# Patient Record
Sex: Female | Born: 1960 | Race: Black or African American | Hispanic: No | Marital: Married | Smoking: Never smoker
Health system: Southern US, Community
[De-identification: ages and names within clinical notes are randomized; demographics above are authoritative.]

## PROBLEM LIST (undated history)

## (undated) DIAGNOSIS — G4730 Sleep apnea, unspecified: Secondary | ICD-10-CM

## (undated) DIAGNOSIS — R600 Localized edema: Secondary | ICD-10-CM

## (undated) DIAGNOSIS — E669 Obesity, unspecified: Secondary | ICD-10-CM

## (undated) DIAGNOSIS — I219 Acute myocardial infarction, unspecified: Secondary | ICD-10-CM

## (undated) DIAGNOSIS — T7840XA Allergy, unspecified, initial encounter: Secondary | ICD-10-CM

## (undated) DIAGNOSIS — I890 Lymphedema, not elsewhere classified: Secondary | ICD-10-CM

## (undated) DIAGNOSIS — T753XXA Motion sickness, initial encounter: Secondary | ICD-10-CM

## (undated) HISTORY — DX: Obesity, unspecified: E66.9

## (undated) HISTORY — DX: Allergy, unspecified, initial encounter: T78.40XA

## (undated) HISTORY — DX: Sleep apnea, unspecified: G47.30

## (undated) HISTORY — DX: Localized edema: R60.0

## (undated) HISTORY — DX: Lymphedema, not elsewhere classified: I89.0

---

## 2004-10-10 HISTORY — PX: ABDOMINAL HYSTERECTOMY: SHX81

## 2004-11-15 ENCOUNTER — Ambulatory Visit: Payer: Self-pay | Admitting: Family Medicine

## 2004-11-17 ENCOUNTER — Ambulatory Visit: Payer: Self-pay | Admitting: Family Medicine

## 2005-09-27 ENCOUNTER — Ambulatory Visit: Payer: Self-pay | Admitting: Obstetrics & Gynecology

## 2006-06-05 ENCOUNTER — Ambulatory Visit: Payer: Self-pay | Admitting: Family Medicine

## 2006-12-08 ENCOUNTER — Ambulatory Visit: Payer: Self-pay | Admitting: Family Medicine

## 2008-02-15 ENCOUNTER — Ambulatory Visit: Payer: Self-pay | Admitting: Family Medicine

## 2008-10-10 ENCOUNTER — Emergency Department: Payer: Self-pay | Admitting: Emergency Medicine

## 2009-12-14 ENCOUNTER — Ambulatory Visit: Payer: Self-pay | Admitting: Family Medicine

## 2011-07-09 IMAGING — CR DG HAND COMPLETE 3+V*L*
1 series · 3 of 3 positions shown · non-contrast
Comparison: none

REASON FOR EXAM: pain
COMMENTS:

PROCEDURE:     KDR - KDXR HAND LT COMPLETE W/OBLIQUES  - December 14, 2009  [DATE]
RESULT:     No fracture, dislocation or other acute bony abnormality is
identified. No definite arthritic changes are seen. No radiodense soft
tissue foreign body is noted.

[Series 2: view not recorded · 0.17mm/px · 3 of 3 slices shown]
[im 1/3]
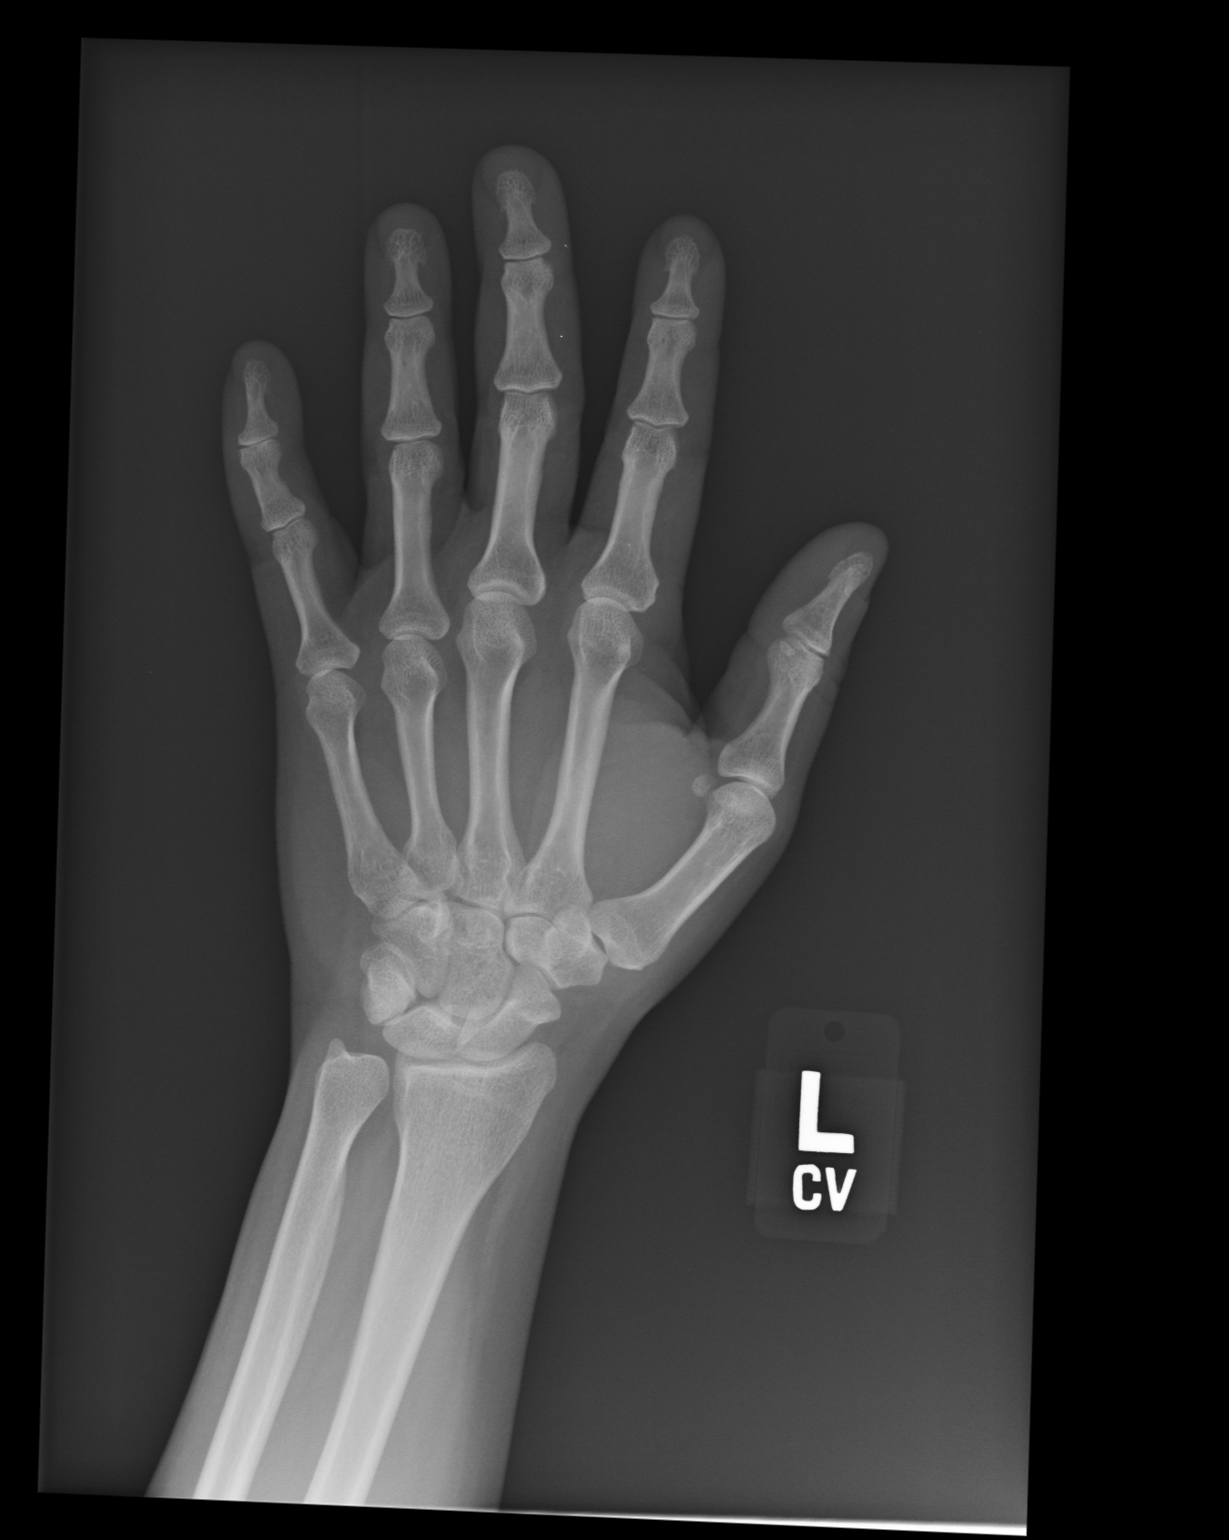
[im 2/3]
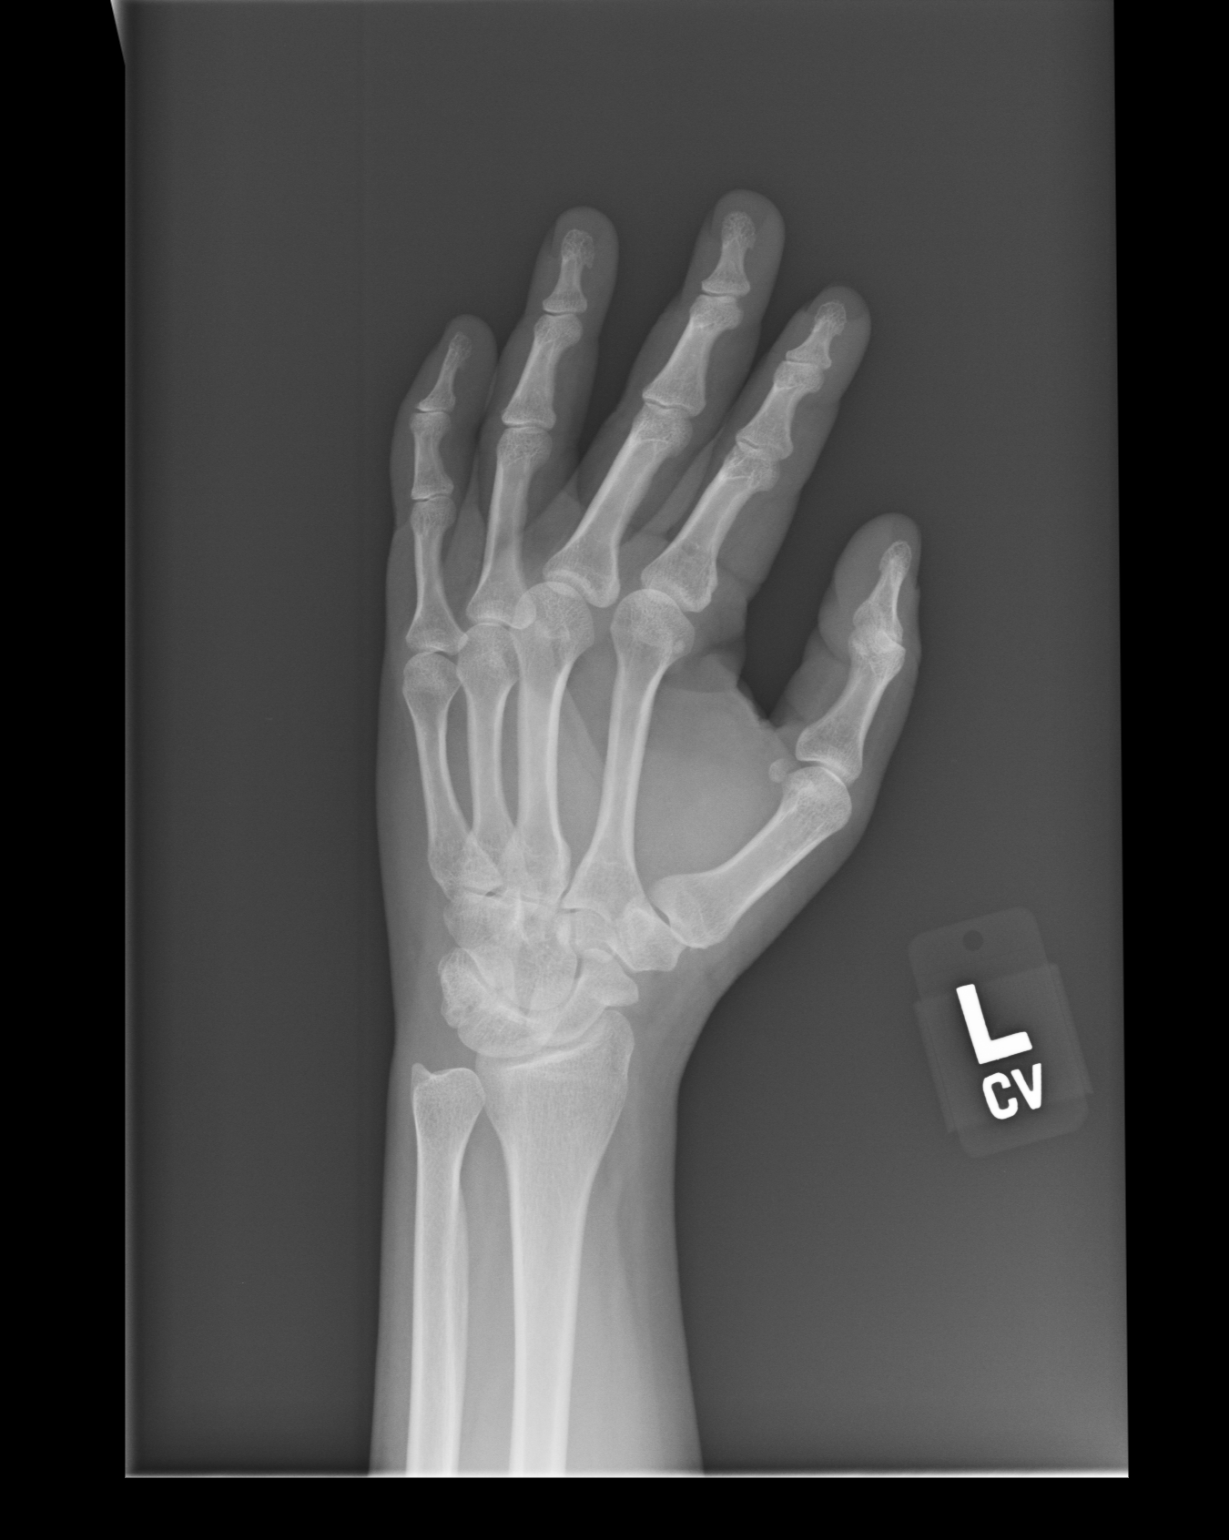
[im 3/3]
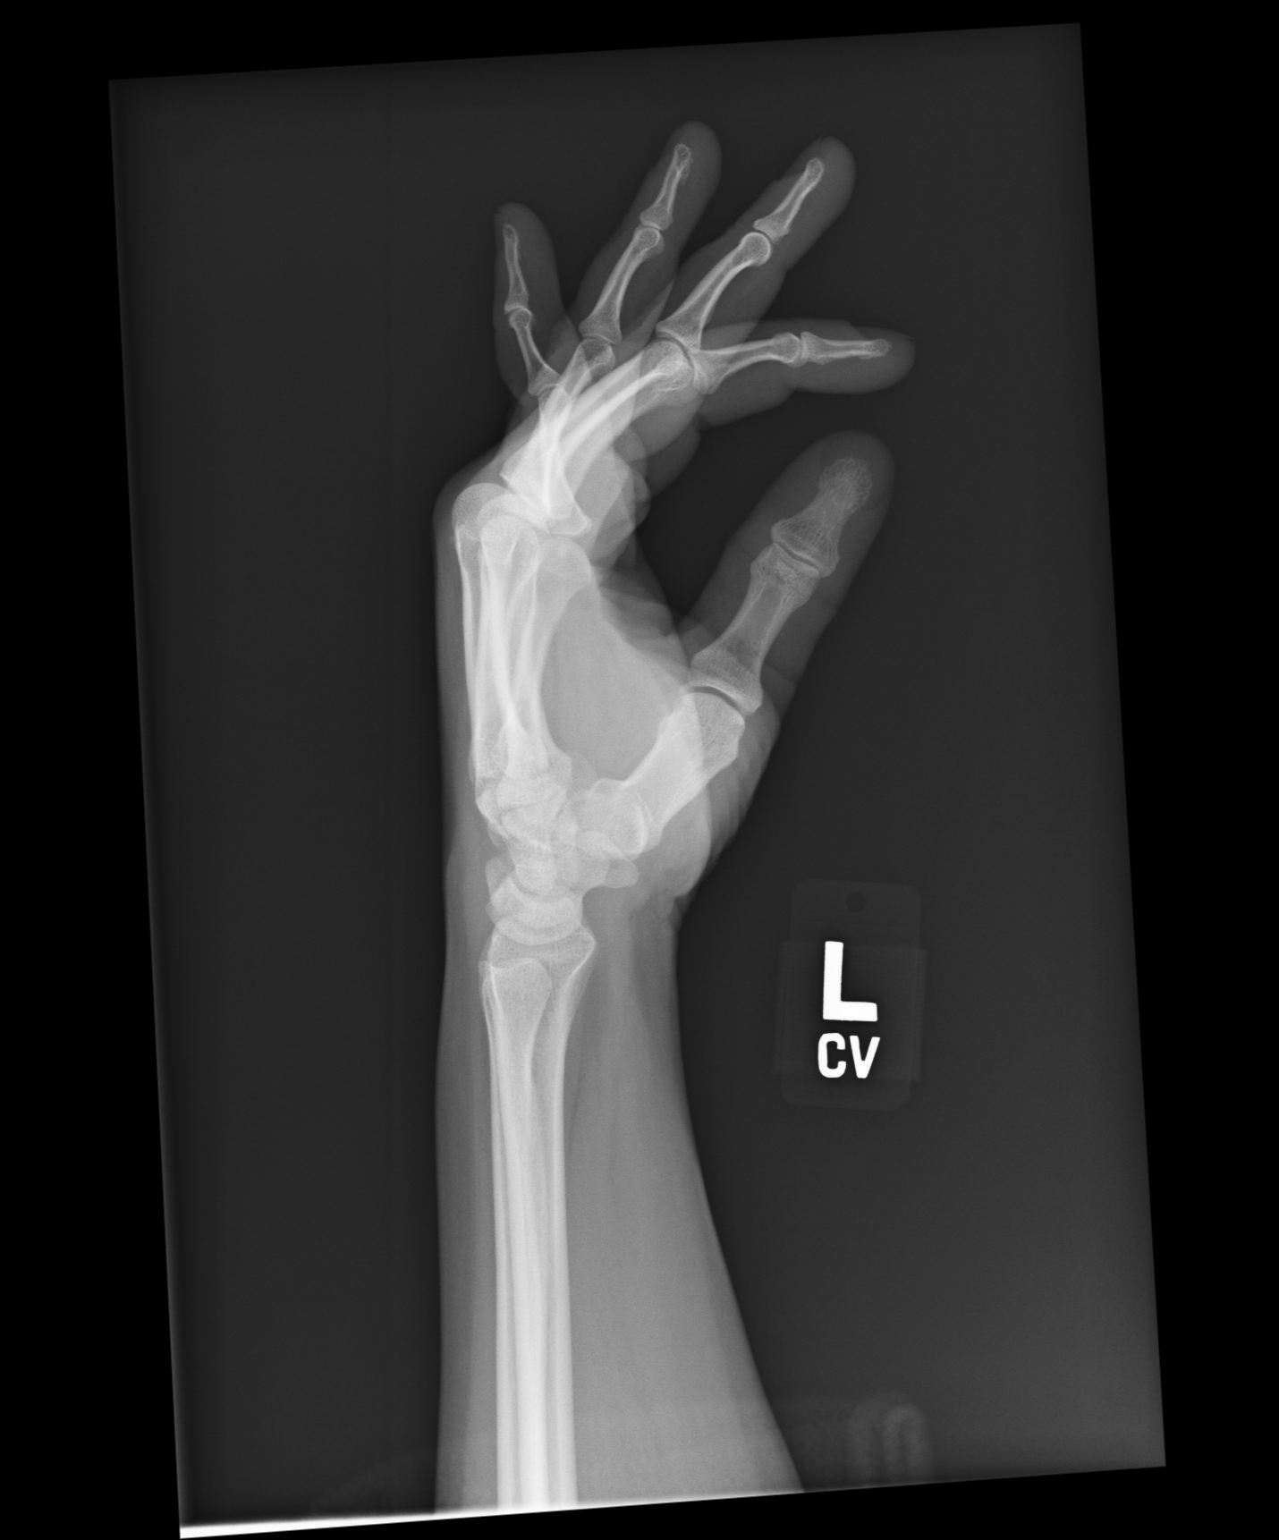

[3 of 3 positions shown; findings below may reference images not displayed]

IMPRESSION: 1.     No significant abnormalities are identified.

## 2013-11-13 ENCOUNTER — Ambulatory Visit: Payer: Self-pay | Admitting: Family Medicine

## 2013-11-13 LAB — URINALYSIS, COMPLETE

## 2013-11-15 LAB — URINE CULTURE

## 2015-06-04 ENCOUNTER — Ambulatory Visit: Payer: Self-pay | Admitting: Family Medicine

## 2015-07-24 ENCOUNTER — Telehealth: Payer: Self-pay | Admitting: Family Medicine

## 2015-07-24 NOTE — Telephone Encounter (Signed)
Pt would like refill on fluid pill to be sent to [address]. Pt has an appt on 08/27/15

## 2015-07-27 MED ORDER — TRIAMTERENE-HCTZ 37.5-25 MG PO TABS: 1.0000 | ORAL_TABLET | Freq: Every day | ORAL | Status: DC

## 2015-07-27 NOTE — Telephone Encounter (Signed)
Pt last OV was 08/20/14 for CPE,no current medications were in allscripts.

## 2015-07-27 NOTE — Telephone Encounter (Signed)
Okay 3 month.  Was returned office before further refills for lab work and evaluation

## 2015-07-27 NOTE — Telephone Encounter (Signed)
Pt must RTO for any refills and needs current lab work

## 2015-08-19 ENCOUNTER — Ambulatory Visit (INDEPENDENT_AMBULATORY_CARE_PROVIDER_SITE_OTHER): Payer: BLUE CROSS/BLUE SHIELD | Admitting: Family Medicine

## 2015-08-19 ENCOUNTER — Encounter: Payer: Self-pay | Admitting: Family Medicine

## 2015-08-19 VITALS — BP 122/77 | HR 98 | Temp 98.7°F | Resp 18 | Ht 64.0 in | Wt 199.3 lb

## 2015-08-19 DIAGNOSIS — R1011 Right upper quadrant pain: Secondary | ICD-10-CM | POA: Diagnosis not present

## 2015-08-19 DIAGNOSIS — R10A1 Flank pain, right side: Secondary | ICD-10-CM | POA: Insufficient documentation

## 2015-08-19 DIAGNOSIS — R109 Unspecified abdominal pain: Secondary | ICD-10-CM | POA: Insufficient documentation

## 2015-08-19 MED ORDER — TRAMADOL HCL 50 MG PO TABS: 50.0000 mg | ORAL_TABLET | Freq: Three times a day (TID) | ORAL | Status: DC | PRN

## 2015-08-19 NOTE — Progress Notes (Signed)
Name: Sierra Buck   MRN: [id number]    DOB: 02-07-1961   Date:08/19/2015       Progress Note  Subjective  Chief Complaint  Chief Complaint  Patient presents with  . Back Pain    x2 days, patient states hurts when she walkss, pressure when urinating     Back Pain This is a new problem. The current episode started yesterday. The pain is present in the lumbar spine. The pain does not radiate. The pain is at a severity of 8/10. The symptoms are aggravated by position (walking makes it worse.). Pertinent negatives include no bladder incontinence, bowel incontinence, dysuria, fever, leg pain, numbness, paresis, paresthesias or weakness. She has tried nothing for the symptoms.    Past Medical History  Diagnosis Date  . Allergy   . Sleep apnea   . Bilateral leg edema     Past Surgical History  Procedure Laterality Date  . Abdominal hysterectomy  2006    Partial    Family History  Problem Relation Age of Onset  . Hypertension Mother   . Hypertension Sister     Social History   Social History  . Marital Status: Married    Spouse Name: N/A  . Number of Children: N/A  . Years of Education: N/A   Occupational History  . Not on file.   Social History Main Topics  . Smoking status: Never Smoker   . Smokeless tobacco: Never Used  . Alcohol Use: 0.0 oz/week    0 Standard drinks or equivalent per week     Comment: occasional  . Drug Use: No  . Sexual Activity:    Partners: Male   Other Topics Concern  . Not on file   Social History Narrative  . No narrative on file     Current outpatient prescriptions:  .  triamterene-hydrochlorothiazide (MAXZIDE-25) 37.5-25 MG tablet, Take 1 tablet by mouth daily., Disp: 30 tablet, Rfl: 1  No Known Allergies  Review of Systems  Constitutional: Negative for fever.  Gastrointestinal: Negative for bowel incontinence.  Genitourinary: Negative for bladder incontinence and dysuria.  Musculoskeletal: Positive for back pain.    Neurological: Negative for weakness, numbness and paresthesias.   Objective  Filed Vitals:   08/19/15 1525  BP: 122/77  Pulse: 98  Temp: 98.7 F (37.1 C)  TempSrc: Oral  Resp: 18  Height: 5\' 4"  (1.626 m)  Weight: 199 lb 4.8 oz (90.402 kg)  SpO2: 98%    Physical Exam  Constitutional: She is oriented to person, place, and time and well-developed, well-nourished, and in no distress.  Cardiovascular: Normal rate, regular rhythm and normal heart sounds.   No murmur heard. Pulmonary/Chest: Effort normal and breath sounds normal. She has no rales.  Abdominal: Soft. Bowel sounds are normal. There is tenderness (Patient 'feels' the pain in her right flank, no tenderness over spine or paraspinal muscles). There is no CVA tenderness.  Musculoskeletal:       Lumbar back: She exhibits normal range of motion, no tenderness, no pain and no spasm.       Back:  Neurological: She is alert and oriented to person, place, and time.  Nursing note and vitals reviewed.  Assessment & Plan  1. Acute right flank pain Pressure but no tenderness over the right flank. We will rule out renal etiology with ultrasound. Obtain laboratory workup and urinalysis.  - US Renal; Future - CBC with Differential - COMPLETE METABOLIC PANEL WITH GFR - Urine Culture - Urinalysis, Routine w  reflex microscopic - traMADol (ULTRAM) 50 MG tablet; Take 1 tablet (50 mg total) by mouth every 8 (eight) hours as needed.  Dispense: 30 tablet; Refill: 0   Judeen Geralds [facility] 08/19/2015 4:14 PM

## 2015-08-20 LAB — CBC WITH DIFFERENTIAL/PLATELET
BASOS ABS: 0 10*3/uL (ref 0.0–0.2)
Basos: 1 %
EOS (ABSOLUTE): 0.3 10*3/uL (ref 0.0–0.4)
Eos: 5 %
Hematocrit: 43 % (ref 34.0–46.6)
Hemoglobin: 13.8 g/dL (ref 11.1–15.9)
IMMATURE GRANULOCYTES: 0 %
Immature Grans (Abs): 0 10*3/uL (ref 0.0–0.1)
Lymphocytes Absolute: 2.9 10*3/uL (ref 0.7–3.1)
Lymphs: 49 %
MCH: 28.5 pg (ref 26.6–33.0)
MCHC: 32.1 g/dL (ref 31.5–35.7)
MCV: 89 fL (ref 79–97)
MONOS ABS: 0.6 10*3/uL (ref 0.1–0.9)
Monocytes: 11 %
NEUTROS PCT: 34 %
Neutrophils Absolute: 2 10*3/uL (ref 1.4–7.0)
PLATELETS: 323 10*3/uL (ref 150–379)
RBC: 4.84 x10E6/uL (ref 3.77–5.28)
RDW: 13.8 % (ref 12.3–15.4)
WBC: 5.9 10*3/uL (ref 3.4–10.8)

## 2015-08-20 LAB — URINALYSIS, ROUTINE W REFLEX MICROSCOPIC
Bilirubin, UA: NEGATIVE
GLUCOSE, UA: NEGATIVE
Ketones, UA: NEGATIVE
LEUKOCYTES UA: NEGATIVE
Nitrite, UA: NEGATIVE
PROTEIN UA: NEGATIVE
SPEC GRAV UA: 1.025 (ref 1.005–1.030)
Urobilinogen, Ur: 0.2 mg/dL (ref 0.2–1.0)
pH, UA: 5.5 (ref 5.0–7.5)

## 2015-08-20 LAB — MICROSCOPIC EXAMINATION: Casts: NONE SEEN /lpf

## 2015-08-20 LAB — COMPREHENSIVE METABOLIC PANEL
A/G RATIO: 1.3 (ref 1.1–2.5)
ALK PHOS: 94 IU/L (ref 39–117)
ALT: 14 IU/L (ref 0–32)
AST: 18 IU/L (ref 0–40)
Albumin: 4.4 g/dL (ref 3.5–5.5)
BILIRUBIN TOTAL: 0.2 mg/dL (ref 0.0–1.2)
BUN/Creatinine Ratio: 19 (ref 9–23)
BUN: 15 mg/dL (ref 6–24)
CHLORIDE: 99 mmol/L (ref 97–106)
CO2: 28 mmol/L (ref 18–29)
Calcium: 9.9 mg/dL (ref 8.7–10.2)
Creatinine, Ser: 0.77 mg/dL (ref 0.57–1.00)
GFR calc non Af Amer: 90 mL/min/{1.73_m2} (ref >59)
GFR, EST AFRICAN AMERICAN: 103 mL/min/{1.73_m2} (ref >59)
Globulin, Total: 3.3 g/dL (ref 1.5–4.5)
Glucose: 72 mg/dL (ref 65–99)
POTASSIUM: 4.2 mmol/L (ref 3.5–5.2)
Sodium: 143 mmol/L (ref 136–144)
TOTAL PROTEIN: 7.7 g/dL (ref 6.0–8.5)

## 2015-08-20 LAB — URINE CULTURE

## 2015-08-27 ENCOUNTER — Encounter: Payer: Self-pay | Admitting: Family Medicine

## 2015-08-27 ENCOUNTER — Ambulatory Visit (INDEPENDENT_AMBULATORY_CARE_PROVIDER_SITE_OTHER): Payer: BLUE CROSS/BLUE SHIELD | Admitting: Family Medicine

## 2015-08-27 VITALS — BP 120/70 | HR 77 | Temp 98.6°F | Resp 16 | Ht 64.0 in | Wt 201.0 lb

## 2015-08-27 DIAGNOSIS — Z23 Encounter for immunization: Secondary | ICD-10-CM

## 2015-08-27 DIAGNOSIS — Z0000 Encounter for general adult medical examination without abnormal findings: Secondary | ICD-10-CM | POA: Diagnosis not present

## 2015-08-27 NOTE — Progress Notes (Signed)
Name: Sierra Buck   MRN: [id number]    DOB: 1960/10/17   Date:08/27/2015       Progress Note  Subjective  Chief Complaint  Chief Complaint  Patient presents with  . Annual Exam    HPI  54 year old female presenting for annual H&P. Her baseline problems are stable.  Past Medical History  Diagnosis Date  . Allergy   . Sleep apnea   . Bilateral leg edema     Social History  Substance Use Topics  . Smoking status: Never Smoker   . Smokeless tobacco: Never Used  . Alcohol Use: 0.0 oz/week    0 Standard drinks or equivalent per week     Comment: occasional     Current outpatient prescriptions:  .  triamterene-hydrochlorothiazide (MAXZIDE-25) 37.5-25 MG tablet, Take 1 tablet by mouth daily., Disp: 30 tablet, Rfl: 1 .  traMADol (ULTRAM) 50 MG tablet, Take 1 tablet (50 mg total) by mouth every 8 (eight) hours as needed., Disp: 30 tablet, Rfl: 0  No Known Allergies  Review of Systems  Constitutional: Negative for fever, chills and weight loss.  HENT: Negative for congestion, hearing loss, sore throat and tinnitus.   Eyes: Negative for blurred vision, double vision and redness.  Respiratory: Negative for cough, hemoptysis and shortness of breath.   Cardiovascular: Negative for chest pain, palpitations, orthopnea, claudication and leg swelling.  Gastrointestinal: Negative for heartburn, nausea, vomiting, diarrhea, constipation and blood in stool.  Genitourinary: Negative for dysuria, urgency, frequency and hematuria.  Musculoskeletal: Negative for myalgias, back pain, joint pain, falls and neck pain.  Skin: Negative for itching.  Neurological: Negative for dizziness, tingling, tremors, focal weakness, seizures, loss of consciousness, weakness and headaches.  Endo/Heme/Allergies: Does not bruise/bleed easily.  Psychiatric/Behavioral: Negative for depression and substance abuse. The patient is not nervous/anxious and does not have insomnia.      Objective  Filed  Vitals:   08/27/15 1149  BP: 120/70  Pulse: 77  Temp: 98.6 F (37 C)  TempSrc: Oral  Resp: 16  Height: 5\' 4"  (1.626 m)  Weight: 201 lb (91.173 kg)  SpO2: 96%     Physical Exam  Constitutional: She is oriented to person, place, and time and well-developed, well-nourished, and in no distress.  HENT:  Head: Normocephalic.  Eyes: EOM are normal. Pupils are equal, round, and reactive to light.  Neck: Normal range of motion. No thyromegaly present.  Cardiovascular: Normal rate, regular rhythm and normal heart sounds.   No murmur heard. Pulmonary/Chest: Effort normal and breath sounds normal.  Breast exam within normal limits  Abdominal: Soft. Bowel sounds are normal.  Musculoskeletal: Normal range of motion. She exhibits edema.  Neurological: She is alert and oriented to person, place, and time. No cranial nerve deficit. Gait normal.  Skin: Skin is warm and dry. No rash noted.  Psychiatric: Memory and affect normal.      Assessment & Plan  1. Annual physical exam  - TSH - Lipid panel  2. Need for influenza vaccination Given - Flu Vaccine QUAD 36+ mos PF IM (Fluarix & Fluzone Quad PF)

## 2015-09-04 ENCOUNTER — Ambulatory Visit: Payer: BLUE CROSS/BLUE SHIELD

## 2015-09-10 LAB — LIPID PANEL
CHOL/HDL RATIO: 3.6 ratio (ref 0.0–4.4)
Cholesterol, Total: 269 mg/dL — ABNORMAL HIGH (ref 100–199)
HDL: 75 mg/dL (ref >39)
LDL Calculated: 164 mg/dL — ABNORMAL HIGH (ref 0–99)
Triglycerides: 148 mg/dL (ref 0–149)
VLDL Cholesterol Cal: 30 mg/dL (ref 5–40)

## 2015-09-10 LAB — TSH: TSH: 2.62 u[IU]/mL (ref 0.450–4.500)

## 2015-11-17 ENCOUNTER — Other Ambulatory Visit: Payer: Self-pay | Admitting: Family Medicine

## 2016-02-02 ENCOUNTER — Other Ambulatory Visit: Payer: Self-pay | Admitting: Family Medicine

## 2016-02-02 ENCOUNTER — Encounter: Payer: Self-pay | Admitting: General Surgery

## 2016-02-02 DIAGNOSIS — Z1211 Encounter for screening for malignant neoplasm of colon: Secondary | ICD-10-CM

## 2016-02-02 DIAGNOSIS — Z1231 Encounter for screening mammogram for malignant neoplasm of breast: Secondary | ICD-10-CM

## 2016-02-08 ENCOUNTER — Ambulatory Visit
Admission: RE | Admit: 2016-02-08 | Discharge: 2016-02-08 | Disposition: A | Discharge status: Home / Self Care | Payer: Managed Care, Other (non HMO) | Source: Ambulatory Visit | Attending: Family Medicine | Admitting: Family Medicine

## 2016-02-08 DIAGNOSIS — Z1231 Encounter for screening mammogram for malignant neoplasm of breast: Secondary | ICD-10-CM

## 2016-02-09 ENCOUNTER — Other Ambulatory Visit: Payer: Self-pay | Admitting: Family Medicine

## 2016-02-09 DIAGNOSIS — R928 Other abnormal and inconclusive findings on diagnostic imaging of breast: Secondary | ICD-10-CM

## 2016-03-10 ENCOUNTER — Encounter: Payer: Self-pay | Admitting: General Surgery

## 2016-03-10 ENCOUNTER — Ambulatory Visit (INDEPENDENT_AMBULATORY_CARE_PROVIDER_SITE_OTHER): Payer: Managed Care, Other (non HMO) | Admitting: General Surgery

## 2016-03-10 VITALS — BP 120/74 | HR 72 | Resp 12 | Ht 64.5 in | Wt 217.0 lb

## 2016-03-10 DIAGNOSIS — Z1211 Encounter for screening for malignant neoplasm of colon: Secondary | ICD-10-CM | POA: Diagnosis not present

## 2016-03-10 MED ORDER — POLYETHYLENE GLYCOL 3350 17 GM/SCOOP PO POWD: ORAL | Status: DC

## 2016-03-10 NOTE — Progress Notes (Signed)
Patient ID: Sierra Buck, female   DOB: [birth year], 55 y.o.   MRN: [id number]  Chief Complaint  Patient presents with  . Colonoscopy    HPI Sierra Buck is a 55 y.o. female.  Who presents for a colonoscopy discussion, non prior. Denies any gastrointestinal issues. Bowels move regular and no bleeding noted.  The patient reports that her father (estranged?) Died from lung cancer, and she was unsure if this had metastasized from elsewhere. Based on the date of birth that she provided, I could find no direct patient matching that profile in the [place].  She has a brother who is alive and well.  The patient reports lower extremity edema was noted in high school and has persisted since that time. She reports undergoing a vascular evaluation in the past with no etiology identified. She does report that it response to leg elevation (marked decrease in swelling overnight, and having a lower overall body weight.  I person review the patient's history.  HPI  Past Medical History  Diagnosis Date  . Allergy   . Sleep apnea     CPAP  . Bilateral leg edema     Past Surgical History  Procedure Laterality Date  . Abdominal hysterectomy  2006    Partial    Family History  Problem Relation Age of Onset  . Hypertension Mother   . Hypertension Sister   . Hypertension Paternal Aunt     pat great aunt  . Cancer Father     colon or lung ?  . Breast cancer Paternal Aunt     Social History Social History  Substance Use Topics  . Smoking status: Never Smoker   . Smokeless tobacco: Never Used  . Alcohol Use: 0.0 oz/week    0 Standard drinks or equivalent per week     Comment: occasional    No Known Allergies  Current Outpatient Prescriptions  Medication Sig Dispense Refill  . triamterene-hydrochlorothiazide (MAXZIDE-25) 37.5-25 MG tablet TAKE 1 TABLET BY MOUTH DAILY. 30 tablet 1  . polyethylene glycol powder (GLYCOLAX/MIRALAX) powder 255 grams one bottle for colonoscopy  prep 255 g 0   No current facility-administered medications for this visit.    Review of Systems Review of Systems  Constitutional: Negative.   Respiratory: Negative.   Cardiovascular: Negative.     Blood pressure 120/74, pulse 72, resp. rate 12, height 5' 4.5" (1.638 m), weight 217 lb (98.431 kg).  Physical Exam Physical Exam  Constitutional: She is oriented to person, place, and time. She appears well-developed and well-nourished.  Eyes: Conjunctivae are normal. No scleral icterus.  Neck: Neck supple.  Cardiovascular: Normal rate and normal heart sounds.   Pulmonary/Chest: Effort normal and breath sounds normal.  Abdominal: There is no tenderness.  Lymphadenopathy:    She has no cervical adenopathy.  Neurological: She is alert and oriented to person, place, and time.  Skin: Skin is warm and dry.    Data Reviewed November 2016 laboratory studies and PCP notes reviewed. Normal CBC and renal function.  Assessment    Candidate for screening colonoscopy.    Plan        Colonoscopy with possible biopsy/polypectomy prn: Information regarding the procedure, including its potential risks and complications (including but not limited to perforation of the bowel, which may require emergency surgery to repair, and bleeding) was verbally given to the patient. Educational information regarding lower intestinal endoscopy was given to the patient. Written instructions for how to complete the bowel prep using Miralax  were provided. The importance of drinking ample fluids to avoid dehydration as a result of the prep emphasized  Patient has been scheduled for a colonoscopy on 04-26-16 at [facility].   PCP:  Ashok Norris  This information has been scribed by Karie Fetch RN, BSN,BC.   Robert Bellow 03/10/2016, 6:00 PM

## 2016-03-10 NOTE — Patient Instructions (Addendum)
Colonoscopy A colonoscopy is an exam to look at the entire large intestine (colon). This exam can help find problems such as tumors, polyps, inflammation, and areas of bleeding. The exam takes about 1 hour.  LET [facility] CARE PROVIDER KNOW ABOUT:   Any allergies you have.  All medicines you are taking, including vitamins, herbs, eye drops, creams, and over-the-counter medicines.  Previous problems you or members of your family have had with the use of anesthetics.  Any blood disorders you have.  Previous surgeries you have had.  Medical conditions you have. RISKS AND COMPLICATIONS  Generally, this is a safe procedure. However, as with any procedure, complications can occur. Possible complications include:  Bleeding.  Tearing or rupture of the colon wall.  Reaction to medicines given during the exam.  Infection (rare). BEFORE THE PROCEDURE   Ask your health care provider about changing or stopping your regular medicines.  You may be prescribed an oral bowel prep. This involves drinking a large amount of medicated liquid, starting the day before your procedure. The liquid will cause you to have multiple loose stools until your stool is almost clear or light green. This cleans out your colon in preparation for the procedure.  Do not eat or drink anything else once you have started the bowel prep, unless your health care provider tells you it is safe to do so.  Arrange for someone to drive you home after the procedure. PROCEDURE   You will be given medicine to help you relax (sedative).  You will lie on your side with your knees bent.  A long, flexible tube with a light and camera on the end (colonoscope) will be inserted through the rectum and into the colon. The camera sends video back to a computer screen as it moves through the colon. The colonoscope also releases carbon dioxide gas to inflate the colon. This helps your health care provider see the area better.  During  the exam, your health care provider may take a small tissue sample (biopsy) to be examined under a microscope if any abnormalities are found.  The exam is finished when the entire colon has been viewed. AFTER THE PROCEDURE   Do not drive for 24 hours after the exam.  You may have a small amount of blood in your stool.  You may pass moderate amounts of gas and have mild abdominal cramping or bloating. This is caused by the gas used to inflate your colon during the exam.  Ask when your test results will be ready and how you will get your results. Make sure you get your test results.   This information is not intended to replace advice given to you by your health care provider. Make sure you discuss any questions you have with your health care provider.   Document Released: 09/23/2000 Document Revised: 07/17/2013 Document Reviewed: 06/03/2013 Elsevier Interactive Patient Education [employer].  Patient has been scheduled for a colonoscopy on 04-26-16 at [facility].

## 2016-03-11 ENCOUNTER — Telehealth: Payer: Self-pay

## 2016-03-11 NOTE — Telephone Encounter (Signed)
Notified patient of need to bring her CPAP machine with her when she has her colonoscopy. Patient understands and is in agreement.

## 2016-03-11 NOTE — Telephone Encounter (Signed)
-----   Message from Robert Bellow, MD sent at 03/10/2016  6:04 PM EDT ----- The patient is scheduled for a colonoscopy in July. Please ask her to bring her CPAP machine with her for the procedure. Thank you

## 2016-03-21 ENCOUNTER — Ambulatory Visit
Admission: RE | Admit: 2016-03-21 | Discharge: 2016-03-21 | Disposition: A | Discharge status: Home / Self Care | Payer: Managed Care, Other (non HMO) | Source: Ambulatory Visit | Attending: Family Medicine | Admitting: Family Medicine

## 2016-03-21 DIAGNOSIS — R928 Other abnormal and inconclusive findings on diagnostic imaging of breast: Secondary | ICD-10-CM

## 2016-04-12 ENCOUNTER — Other Ambulatory Visit: Payer: Self-pay | Admitting: Family Medicine

## 2016-04-18 ENCOUNTER — Telehealth: Payer: Self-pay | Admitting: *Deleted

## 2016-04-18 NOTE — Telephone Encounter (Signed)
Message left for patient to call the office.   Patient is scheduled for a colonoscopy on 04-26-16 at [facility] with Dr. Bary Castilla. We need to confirm no medication changes since last office visit. Also, need to verify she has Miralax prescription.

## 2016-04-19 NOTE — Telephone Encounter (Signed)
Patient need refills. Do not see a previous appointment on file

## 2016-04-19 NOTE — Telephone Encounter (Signed)
Last cmp from fall 2016 reviewed; Rx approved

## 2016-04-25 ENCOUNTER — Encounter: Payer: Self-pay | Admitting: *Deleted

## 2016-04-25 NOTE — Telephone Encounter (Signed)
Another message left for patient to call the office.

## 2016-04-25 NOTE — Telephone Encounter (Signed)
Patient states that medications have not changed. Confirmed that she knows to bring her CPAP machine with her that day.

## 2016-04-26 ENCOUNTER — Ambulatory Visit: Payer: Managed Care, Other (non HMO)

## 2016-04-26 ENCOUNTER — Ambulatory Visit
Admission: RE | Admit: 2016-04-26 | Discharge: 2016-04-26 | Disposition: A | Discharge status: Home / Self Care | Payer: Managed Care, Other (non HMO) | Source: Ambulatory Visit | Attending: General Surgery | Admitting: General Surgery

## 2016-04-26 ENCOUNTER — Encounter
Admission: RE | Disposition: A | Discharge status: Home / Self Care | Payer: Self-pay | Source: Ambulatory Visit | Attending: General Surgery

## 2016-04-26 ENCOUNTER — Encounter: Payer: Self-pay | Admitting: *Deleted

## 2016-04-26 DIAGNOSIS — G4730 Sleep apnea, unspecified: Secondary | ICD-10-CM | POA: Diagnosis not present

## 2016-04-26 DIAGNOSIS — D122 Benign neoplasm of ascending colon: Secondary | ICD-10-CM | POA: Insufficient documentation

## 2016-04-26 DIAGNOSIS — D125 Benign neoplasm of sigmoid colon: Secondary | ICD-10-CM | POA: Diagnosis not present

## 2016-04-26 DIAGNOSIS — E669 Obesity, unspecified: Secondary | ICD-10-CM | POA: Diagnosis not present

## 2016-04-26 DIAGNOSIS — Z1211 Encounter for screening for malignant neoplasm of colon: Secondary | ICD-10-CM | POA: Insufficient documentation

## 2016-04-26 DIAGNOSIS — Z6835 Body mass index (BMI) 35.0-35.9, adult: Secondary | ICD-10-CM | POA: Insufficient documentation

## 2016-04-26 DIAGNOSIS — Z90710 Acquired absence of both cervix and uterus: Secondary | ICD-10-CM | POA: Diagnosis not present

## 2016-04-26 DIAGNOSIS — Z79899 Other long term (current) drug therapy: Secondary | ICD-10-CM | POA: Diagnosis not present

## 2016-04-26 HISTORY — PX: COLONOSCOPY WITH PROPOFOL: SHX5780

## 2016-04-26 SURGERY — COLONOSCOPY WITH PROPOFOL
Anesthesia: General

## 2016-04-26 MED ORDER — LIDOCAINE HCL (CARDIAC) 20 MG/ML IV SOLN
INTRAVENOUS | Status: DC | PRN
  Administered 2016-04-26: 60 mg via INTRAVENOUS

## 2016-04-26 MED ORDER — SODIUM CHLORIDE 0.9 % IV SOLN
INTRAVENOUS | Status: DC
  Administered 2016-04-26: 1000 mL via INTRAVENOUS

## 2016-04-26 MED ORDER — PROPOFOL 500 MG/50ML IV EMUL
INTRAVENOUS | Status: DC | PRN
  Administered 2016-04-26: 150 ug/kg/min via INTRAVENOUS

## 2016-04-26 MED ORDER — PROPOFOL 10 MG/ML IV BOLUS
INTRAVENOUS | Status: DC | PRN
  Administered 2016-04-26: 60 mg via INTRAVENOUS

## 2016-04-26 NOTE — Anesthesia Preprocedure Evaluation (Addendum)
Anesthesia Evaluation  Patient identified by MRN, date of birth, ID band Patient awake    Reviewed: Allergy & Precautions, NPO status , Patient's Chart, lab work & pertinent test results  History of Anesthesia Complications Negative for: history of anesthetic complications  Airway Mallampati: III  TM Distance: >3 FB Neck ROM: Full    Dental no notable dental hx.    Pulmonary sleep apnea , neg COPD,    breath sounds clear to auscultation- rhonchi (-) wheezing      Cardiovascular Exercise Tolerance: Good (-) hypertension(-) CAD and (-) Past MI  Rhythm:Regular Rate:Normal - Systolic murmurs and - Diastolic murmurs    Neuro/Psych negative psych ROS   GI/Hepatic negative GI ROS, Neg liver ROS,   Endo/Other  negative endocrine ROS  Renal/GU negative Renal ROS     Musculoskeletal negative musculoskeletal ROS (+)   Abdominal (+) + obese,   Peds  Hematology negative hematology ROS (+)   Anesthesia Other Findings   Reproductive/Obstetrics negative OB ROS                            Anesthesia Physical Anesthesia Plan  ASA: II  Anesthesia Plan: General   Post-op Pain Management:    Induction: Intravenous  Airway Management Planned: Natural Airway  Additional Equipment:   Intra-op Plan:   Post-operative Plan:   Informed Consent: I have reviewed the patients History and Physical, chart, labs and discussed the procedure including the risks, benefits and alternatives for the proposed anesthesia with the patient or authorized representative who has indicated his/her understanding and acceptance.   Dental advisory given  Plan Discussed with:   Anesthesia Plan Comments:         Anesthesia Quick Evaluation

## 2016-04-26 NOTE — Transfer of Care (Signed)
Immediate Anesthesia Transfer of Care Note  Patient: Sierra Buck  Procedure(s) Performed: Procedure(s): COLONOSCOPY WITH PROPOFOL (N/A)  Patient Location: PACU  Anesthesia Type:General  Level of Consciousness: sedated  Airway & Oxygen Therapy: Patient Spontanous Breathing  Post-op Assessment: Report given to RN and Post -op Vital signs reviewed and stable  Post vital signs: Reviewed and stable  Last Vitals:  Filed Vitals:   04/26/16 1247 04/26/16 1357  BP: 127/73   Pulse: 72 67  Temp: 36.4 C 36.4 C  Resp: 20 13    Last Pain: There were no vitals filed for this visit.       Complications: No apparent anesthesia complications

## 2016-04-26 NOTE — Op Note (Signed)
[facility] Gastroenterology Patient Name: Sierra Buck Procedure Date: 04/26/2016 1:12 PM MRN: [id number] Account #: [id number] Date of Birth: 1961/02/14 Admit Type: Outpatient Age: 55 Room: [facility] ENDO ROOM 4 Gender: Female Note Status: Finalized Procedure:            Colonoscopy Indications:          Screening for colorectal malignant neoplasm Providers:            Robert Bellow, MD Referring MD:         Ashok Norris, MD (Referring MD) Medicines:            Monitored Anesthesia Care Complications:        No immediate complications. Procedure:            Pre-Anesthesia Assessment:                       - Prior to the procedure, a History and Physical was                        performed, and patient medications, allergies and                        sensitivities were reviewed. The patient's tolerance of                        previous anesthesia was reviewed.                       - The risks and benefits of the procedure and the                        sedation options and risks were discussed with the                        patient. All questions were answered and informed                        consent was obtained.                       After obtaining informed consent, the colonoscope was                        passed under direct vision. Throughout the procedure,                        the patient's blood pressure, pulse, and oxygen                        saturations were monitored continuously. The                        Colonoscope was introduced through the anus and                        advanced to the the cecum, identified by appendiceal                        orifice and ileocecal valve. The colonoscopy was  somewhat difficult due to a tortuous colon. Successful                        completion of the procedure was aided by using manual                        pressure. The patient tolerated the procedure well. The                      quality of the bowel preparation was good. Findings:      A 10 mm polyp was found in the mid ascending colon. The polyp was       pedunculated. The polyp was removed with a hot snare. Initially, lost.       Base of polypectomy site biopsied. Polyp subsequently retrieved. All       samples in the same bottle.      The retroflexed view of the distal rectum and anal verge was normal and       showed no anal or rectal abnormalities. Impression:           - One 10 mm polyp in the mid ascending colon, removed                        with a hot snare. Resected and retrieved.                       - The distal rectum and anal verge are normal on                        retroflexion view. Recommendation:       - Telephone endoscopist for pathology results in 1 week. Procedure Code(s):    --- Professional ---                       [phone], Colonoscopy, flexible; with removal of tumor(s),                        polyp(s), or other lesion(s) by snare technique Diagnosis Code(s):    --- Professional ---                       Z12.11, Encounter for screening for malignant neoplasm                        of colon                       D12.2, Benign neoplasm of ascending colon CPT copyright 2016 American Medical Association. All rights reserved. The codes documented in this report are preliminary and upon coder review may  be revised to meet current compliance requirements. Robert Bellow, MD 04/26/2016 1:56:45 PM This report has been signed electronically. Number of Addenda: 0 Note Initiated On: 04/26/2016 1:12 PM Scope Withdrawal Time: 0 hours 22 minutes 12 seconds  Total Procedure Duration: 0 hours 35 minutes 23 seconds       [place]

## 2016-04-26 NOTE — Anesthesia Postprocedure Evaluation (Signed)
Anesthesia Post Note  Patient: Sierra Buck  Procedure(s) Performed: Procedure(s) (LRB): COLONOSCOPY WITH PROPOFOL (N/A)  Patient location during evaluation: PACU Anesthesia Type: General Level of consciousness: awake and alert and oriented Pain management: pain level controlled Vital Signs Assessment: post-procedure vital signs reviewed and stable Respiratory status: spontaneous breathing, nonlabored ventilation and respiratory function stable Cardiovascular status: blood pressure returned to baseline and stable Postop Assessment: no signs of nausea or vomiting Anesthetic complications: no    Last Vitals:  Filed Vitals:   04/26/16 1417 04/26/16 1427  BP: 105/80 113/64  Pulse: 59 55  Temp:    Resp: 15 18    Last Pain: There were no vitals filed for this visit.               Dossie Ocanas

## 2016-04-26 NOTE — H&P (Signed)
Sierra Buck [id number] 1961-04-13     HPI: Healthy woman for screening colonoscopy. Tolerated prep well.  No change in health status.   Prescriptions prior to admission  Medication Sig Dispense Refill Last Dose  . polyethylene glycol powder (GLYCOLAX/MIRALAX) powder 255 grams one bottle for colonoscopy prep 255 g 0   . triamterene-hydrochlorothiazide (MAXZIDE-25) 37.5-25 MG tablet TAKE 1 TABLET BY MOUTH DAILY. 30 tablet 2 04/25/2016 at Unknown time   No Known Allergies Past Medical History  Diagnosis Date  . Allergy   . Sleep apnea     CPAP  . Bilateral leg edema    Past Surgical History  Procedure Laterality Date  . Abdominal hysterectomy  2006    Partial   Social History   Social History  . Marital Status: Married    Spouse Name: N/A  . Number of Children: N/A  . Years of Education: N/A   Occupational History  . Not on file.   Social History Main Topics  . Smoking status: Never Smoker   . Smokeless tobacco: Never Used  . Alcohol Use: 0.0 oz/week    0 Standard drinks or equivalent per week     Comment: occasional  . Drug Use: No  . Sexual Activity:    Partners: Male   Other Topics Concern  . Not on file   Social History Narrative   Social History   Social History Narrative     ROS: Negative.     PE: HEENT: Negative. Lungs: Clear. Cardio: RR.  Assessment/Plan:  Proceed with planned endoscopy.   Robert Bellow 04/26/2016

## 2016-04-27 ENCOUNTER — Encounter: Payer: Self-pay | Admitting: General Surgery

## 2016-04-27 LAB — SURGICAL PATHOLOGY

## 2016-04-28 ENCOUNTER — Telehealth: Payer: Self-pay | Admitting: *Deleted

## 2016-04-28 NOTE — Telephone Encounter (Signed)
-----   Message from Robert Bellow, MD sent at 04/28/2016 10:25 AM EDT ----- Please notify the patient that the polyp was benign and completely removed. She will be encouraged to have a follow-up exam in 3 years based on histology.  ----- Message -----    From: [facility]    Sent: 04/27/2016   5:33 PM      To: Robert Bellow, MD

## 2016-04-28 NOTE — Telephone Encounter (Signed)
Notified patient as instructed, patient pleased. Discussed follow-up appointments, patient agrees  

## 2016-10-11 ENCOUNTER — Other Ambulatory Visit: Payer: Self-pay

## 2016-10-11 MED ORDER — TRIAMTERENE-HCTZ 37.5-25 MG PO TABS: 1.0000 | ORAL_TABLET | Freq: Every day | ORAL | 0 refills | Status: DC

## 2016-10-11 NOTE — Telephone Encounter (Signed)
Please ask pt to schedule an appt here in the next few weeks; her last visit and labs here with in November of 2016 Thank you; I look forward to meeting her 90 day Rx sent as requested

## 2016-10-11 NOTE — Telephone Encounter (Signed)
Patient asking for a 90 day supply.

## 2016-10-12 NOTE — Telephone Encounter (Signed)
Spoke with patient: she will call back to schedule appointment once she get to work

## 2017-03-02 ENCOUNTER — Encounter: Payer: Self-pay | Admitting: Family Medicine

## 2017-03-02 ENCOUNTER — Ambulatory Visit (INDEPENDENT_AMBULATORY_CARE_PROVIDER_SITE_OTHER): Payer: Managed Care, Other (non HMO) | Admitting: Family Medicine

## 2017-03-02 VITALS — BP 136/78 | HR 90 | Temp 97.5°F | Resp 14 | Ht 63.8 in | Wt 219.6 lb

## 2017-03-02 DIAGNOSIS — Z1239 Encounter for other screening for malignant neoplasm of breast: Secondary | ICD-10-CM

## 2017-03-02 DIAGNOSIS — E669 Obesity, unspecified: Secondary | ICD-10-CM

## 2017-03-02 DIAGNOSIS — Z6841 Body Mass Index (BMI) 40.0 and over, adult: Secondary | ICD-10-CM | POA: Insufficient documentation

## 2017-03-02 DIAGNOSIS — L821 Other seborrheic keratosis: Secondary | ICD-10-CM

## 2017-03-02 DIAGNOSIS — Z1231 Encounter for screening mammogram for malignant neoplasm of breast: Secondary | ICD-10-CM | POA: Diagnosis not present

## 2017-03-02 DIAGNOSIS — Z0000 Encounter for general adult medical examination without abnormal findings: Secondary | ICD-10-CM | POA: Diagnosis not present

## 2017-03-02 HISTORY — DX: Obesity, unspecified: E66.9

## 2017-03-02 NOTE — Patient Instructions (Addendum)
We do recommend 800 to 1000 iu of vitamin D3 once a day  Check out [employer] and other websites for [employer] ideas and recipes Try meatless crumbles, Boca burgers, Sweet Earth seitan, [place] vegan sausage mix (all plant protein), Sweet Earth [place] breakfast burritos, etc. You can further limit intake of saturated fats by switching to dairy free products (soy or almond or coconut milk coffee creamer, almond milk, Tofutti brand cream cheese and sour cream, Follow Your Heart vegenaise (mayonnaise alternative) and Follow Your Heart cheese alternative (the Antoine Primas is my favorite), Chao slices  Consider vitamin B12 250 or 500 mg daily or add large flake nutritional yeast to popcorn, casseroles, other dishes (cheesy flavor and very high in B vitamins)  DASH Eating Plan DASH stands for "Dietary Approaches to Stop Hypertension." The DASH eating plan is a healthy eating plan that has been shown to reduce high blood pressure (hypertension). It may also reduce your risk for type 2 diabetes, heart disease, and stroke. The DASH eating plan may also help with weight loss. What are tips for following this plan? General guidelines   Avoid eating more than 2,300 mg (milligrams) of salt (sodium) a day. If you have hypertension, you may need to reduce your sodium intake to 1,500 mg a day.  Limit alcohol intake to no more than 1 drink a day for nonpregnant women and 2 drinks a day for men. One drink equals 12 oz of beer, 5 oz of wine, or 1 oz of hard liquor.  Work with your health care provider to maintain a healthy body weight or to lose weight. Ask what an ideal weight is for you.  Get at least 30 minutes of exercise that causes your heart to beat faster (aerobic exercise) most days of the week. Activities may include walking, swimming, or biking.  Work with your health care provider or diet and nutrition specialist (dietitian) to adjust your eating plan to your individual calorie  needs. Reading food labels   Check food labels for the amount of sodium per serving. Choose foods with less than 5 percent of the Daily Value of sodium. Generally, foods with less than 300 mg of sodium per serving fit into this eating plan.  To find whole grains, look for the word "whole" as the first word in the ingredient list. Shopping   Buy products labeled as "low-sodium" or "no salt added."  Buy fresh foods. Avoid canned foods and premade or frozen meals. Cooking   Avoid adding salt when cooking. Use salt-free seasonings or herbs instead of table salt or sea salt. Check with your health care provider or pharmacist before using salt substitutes.  Do not fry foods. Cook foods using healthy methods such as baking, boiling, grilling, and broiling instead.  Cook with heart-healthy oils, such as olive, canola, soybean, or sunflower oil. Meal planning    Eat a balanced diet that includes:  5 or more servings of fruits and vegetables each day. At each meal, try to fill half of your plate with fruits and vegetables.  Up to 6-8 servings of whole grains each day.  Less than 6 oz of lean meat, poultry, or fish each day. A 3-oz serving of meat is about the same size as a deck of cards. One egg equals 1 oz.  2 servings of low-fat dairy each day.  A serving of nuts, seeds, or beans 5 times each week.  Heart-healthy fats. Healthy fats called Omega-3 fatty acids are found  in foods such as flaxseeds and coldwater fish, like sardines, salmon, and mackerel.  Limit how much you eat of the following:  Canned or prepackaged foods.  Food that is high in trans fat, such as fried foods.  Food that is high in saturated fat, such as fatty meat.  Sweets, desserts, sugary drinks, and other foods with added sugar.  Full-fat dairy products.  Do not salt foods before eating.  Try to eat at least 2 vegetarian meals each week.  Eat more home-cooked food and less restaurant, buffet, and fast  food.  When eating at a restaurant, ask that your food be prepared with less salt or no salt, if possible. What foods are recommended? The items listed may not be a complete list. Talk with your dietitian about what dietary choices are best for you. Grains  Whole-grain or whole-wheat bread. Whole-grain or whole-wheat pasta. Brown rice. [place]. Bulgur. Whole-grain and low-sodium cereals. Pita bread. Low-fat, low-sodium crackers. Whole-wheat flour tortillas. Vegetables  Fresh or frozen vegetables (raw, steamed, roasted, or grilled). Low-sodium or reduced-sodium tomato and vegetable juice. Low-sodium or reduced-sodium tomato sauce and tomato paste. Low-sodium or reduced-sodium canned vegetables. Fruits  All fresh, dried, or frozen fruit. Canned fruit in natural juice (without added sugar). Meat and other protein foods  Skinless chicken or [place]. Ground chicken or [place]. Pork with fat trimmed off. Fish and seafood. Egg whites. Dried beans, peas, or lentils. Unsalted nuts, nut butters, and seeds. Unsalted canned beans. Lean cuts of beef with fat trimmed off. Low-sodium, lean deli meat. Dairy  Low-fat (1%) or fat-free (skim) milk. Fat-free, low-fat, or reduced-fat cheeses. Nonfat, low-sodium ricotta or cottage cheese. Low-fat or nonfat yogurt. Low-fat, low-sodium cheese. Fats and oils  Soft margarine without trans fats. Vegetable oil. Low-fat, reduced-fat, or light mayonnaise and salad dressings (reduced-sodium). Canola, safflower, olive, soybean, and sunflower oils. Avocado. Seasoning and other foods  Herbs. Spices. Seasoning mixes without salt. Unsalted popcorn and pretzels. Fat-free sweets. What foods are not recommended? The items listed may not be a complete list. Talk with your dietitian about what dietary choices are best for you. Grains  Baked goods made with fat, such as croissants, muffins, or some breads. Dry pasta or rice meal packs. Vegetables  Creamed or fried vegetables.  Vegetables in a cheese sauce. Regular canned vegetables (not low-sodium or reduced-sodium). Regular canned tomato sauce and paste (not low-sodium or reduced-sodium). Regular tomato and vegetable juice (not low-sodium or reduced-sodium). Angie Fava. Olives. Fruits  Canned fruit in a light or heavy syrup. Fried fruit. Fruit in cream or butter sauce. Meat and other protein foods  Fatty cuts of meat. Ribs. Fried meat. Berniece Salines. Sausage. Bologna and other processed lunch meats. Salami. Fatback. Hotdogs. Bratwurst. Salted nuts and seeds. Canned beans with added salt. Canned or smoked fish. Whole eggs or egg yolks. Chicken or [place] with skin. Dairy  Whole or 2% milk, cream, and half-and-half. Whole or full-fat cream cheese. Whole-fat or sweetened yogurt. Full-fat cheese. Nondairy creamers. Whipped toppings. Processed cheese and cheese spreads. Fats and oils  Butter. Stick margarine. Lard. Shortening. Ghee. Bacon fat. Tropical oils, such as coconut, palm kernel, or palm oil. Seasoning and other foods  Salted popcorn and pretzels. Onion salt, garlic salt, seasoned salt, table salt, and sea salt. Worcestershire sauce. Tartar sauce. Barbecue sauce. Teriyaki sauce. Soy sauce, including reduced-sodium. Steak sauce. Canned and packaged gravies. Fish sauce. Oyster sauce. Cocktail sauce. Horseradish that you find on the shelf. Ketchup. Mustard. Meat flavorings and tenderizers. Bouillon cubes. Hot  sauce and Tabasco sauce. Premade or packaged marinades. Premade or packaged taco seasonings. Relishes. Regular salad dressings. Where to find more information:  National Heart, Lung, and [place]: https://wilson-eaton.com/  American Heart Association: www.heart.org Summary  The DASH eating plan is a healthy eating plan that has been shown to reduce high blood pressure (hypertension). It may also reduce your risk for type 2 diabetes, heart disease, and stroke.  With the DASH eating plan, you should limit salt (sodium) intake  to 2,300 mg a day. If you have hypertension, you may need to reduce your sodium intake to 1,500 mg a day.  When on the DASH eating plan, aim to eat more fresh fruits and vegetables, whole grains, lean proteins, low-fat dairy, and heart-healthy fats.  Work with your health care provider or diet and nutrition specialist (dietitian) to adjust your eating plan to your individual calorie needs. This information is not intended to replace advice given to you by your health care provider. Make sure you discuss any questions you have with your health care provider. Document Released: 09/15/2011 Document Revised: 09/19/2016 Document Reviewed: 09/19/2016 Elsevier Interactive Patient Education  2017 [employer].

## 2017-03-02 NOTE — Progress Notes (Signed)
Patient ID: Sierra Buck, female   DOB: 10-Jan-1961, 56 y.o.   MRN: [id number]   Subjective:   Sierra Buck is a 56 y.o. female here for a complete physical exam  Interim issues since last visit: lifestyle changes, working on weight loss; occasional bumps under her arms; lots of moles (runs in the family)  USPSTF grade A and B recommendations Depression:  Depression screen [facility] 2/9 03/02/2017 08/27/2015 08/19/2015  Decreased Interest 0 0 0  Down, Depressed, Hopeless 0 0 0  PHQ - 2 Score 0 0 0   Hypertension: rushing BP Readings from Last 3 Encounters:  03/02/17 136/78  04/26/16 113/64  03/10/16 120/74   Obesity: losing weight with lifestyle changes, down 9 pounds Wt Readings from Last 3 Encounters:  03/02/17 219 lb 9.6 oz (99.6 kg)  04/26/16 210 lb (95.3 kg)  03/10/16 217 lb (98.4 kg)   BMI Readings from Last 3 Encounters:  03/02/17 37.93 kg/m  04/26/16 35.49 kg/m  03/10/16 36.67 kg/m    Alcohol: one every two weeks Tobacco use: nonsmoker HIV, hep B, hep C: declined STD testing and prevention (chl/gon/syphilis): declined Intimate partner violence:no abuse Breast cancer: no lumps BRCA gene screening: no breast or ovarian cancer Cervical cancer screening: s/p hysterectomy for breakthrough bleeding, bad cramps; no cancer Osteoporosis: no steroids, no strong fam hx; start age 61 Fall prevention/vitamin D: no supplement; outdoors Lipids:  Lab Results  Component Value Date   CHOL 269 (H) 09/09/2015   Lab Results  Component Value Date   HDL 75 09/09/2015   Lab Results  Component Value Date   LDLCALC 164 (H) 09/09/2015   Lab Results  Component Value Date   TRIG 148 09/09/2015   Lab Results  Component Value Date   CHOLHDL 3.6 09/09/2015   No results found for: LDLDIRECT Glucose:  Glucose  Date Value Ref Range Status  08/19/2015 72 65 - 99 mg/dL Final   Colorectal cancer: 2017, next likely due in 2020 Lung cancer:  n/a AAA: n/a Aspirin:  n/a Diet: lifestyle change, pescartarian, gave up dairy Exercise: walking Skin cancer: did notice a mole on the breast, left; just there, light grey  Past Medical History:  Diagnosis Date  . Allergy   . Bilateral leg edema   . Obesity (BMI 35.0-39.9 without comorbidity) 03/02/2017  . Sleep apnea    CPAP   Past Surgical History:  Procedure Laterality Date  . ABDOMINAL HYSTERECTOMY  2006   Partial  . COLONOSCOPY WITH PROPOFOL N/A 04/26/2016   Procedure: COLONOSCOPY WITH PROPOFOL;  Surgeon: Robert Bellow, MD;  Location: [facility] ENDOSCOPY;  Service: Endoscopy;  Laterality: N/A;   Family History  Problem Relation Age of Onset  . Hypertension Mother   . Hypertension Sister   . Hypertension Paternal Aunt        pat great aunt  . Cancer Father        colon or lung ?  . Breast cancer Paternal Aunt   . Hernia Brother   . ADD / ADHD Son   . Cancer Maternal Grandfather        lung   Social History  Substance Use Topics  . Smoking status: Never Smoker  . Smokeless tobacco: Never Used  . Alcohol use 0.0 oz/week     Comment: occasional   Review of Systems  Objective:   Vitals:   03/02/17 1108  BP: 136/78  Pulse: 90  Resp: 14  Temp: 97.5 F (36.4 C)  TempSrc: Oral  SpO2:  97%  Weight: 219 lb 9.6 oz (99.6 kg)  Height: 5' 3.8" (1.621 m)   Body mass index is 37.93 kg/m. Wt Readings from Last 3 Encounters:  03/02/17 219 lb 9.6 oz (99.6 kg)  04/26/16 210 lb (95.3 kg)  03/10/16 217 lb (98.4 kg)   Physical Exam  Constitutional: She appears well-developed and well-nourished.  HENT:  Head: Normocephalic and atraumatic.  Right Ear: Hearing, tympanic membrane, external ear and ear canal normal.  Left Ear: Hearing, tympanic membrane, external ear and ear canal normal.  Eyes: Conjunctivae and EOM are normal. Right eye exhibits no hordeolum. Left eye exhibits no hordeolum. No scleral icterus.  Neck: Carotid bruit is not present. No thyromegaly present.  Cardiovascular: Normal  rate, regular rhythm, S1 normal, S2 normal and normal heart sounds.   No extrasystoles are present.  Pulmonary/Chest: Effort normal and breath sounds normal. No respiratory distress. Right breast exhibits no inverted nipple, no mass, no nipple discharge, no skin change and no tenderness. Left breast exhibits no inverted nipple, no mass, no nipple discharge, no skin change and no tenderness. Breasts are symmetrical.  Abdominal: Soft. Normal appearance and bowel sounds are normal. She exhibits no distension, no abdominal bruit, no pulsatile midline mass and no mass. There is no hepatosplenomegaly. There is no tenderness. No hernia.  Musculoskeletal: Normal range of motion. She exhibits no edema.  Lymphadenopathy:       Head (right side): No submandibular adenopathy present.       Head (left side): No submandibular adenopathy present.    She has no cervical adenopathy.    She has no axillary adenopathy.  Neurological: She is alert. She displays no tremor. No cranial nerve deficit. She exhibits normal muscle tone. Gait normal.  Reflex Scores:      Patellar reflexes are 2+ on the right side and 2+ on the left side. Skin: Skin is warm and dry. No bruising and no ecchymosis noted. No cyanosis. No pallor.  Numerous brownish-grey keratotic papules on the chest, breasts; definite SK at areolar border on the LEFT; single cherry angioma right lateral breast; healing/resolving inflamed papule RIGHT axilla  Psychiatric: Her speech is normal and behavior is normal. Thought content normal. Her mood appears not anxious. She does not exhibit a depressed mood.    Assessment/Plan:   Problem List Items Addressed This Visit      Musculoskeletal and Integument   Seborrheic keratoses    Scattered, will likely get more; benign        Other   Preventative health care - Primary    USPSTF grade A and B recommendations reviewed with patient; age-appropriate recommendations, preventive care, screening tests, etc  discussed and encouraged; healthy living encouraged; see AVS for patient education given to patient       Relevant Orders   Comprehensive metabolic panel   CBC with Differential/Platelet   Lipid panel   TSH   Obesity (BMI 35.0-39.9 without comorbidity)    So glad that patient is working on lifestyle changes, losing weight       Other Visit Diagnoses    Screening for breast cancer       Relevant Orders   MM Digital Screening      No orders of the defined types were placed in this encounter.  Orders Placed This Encounter  Procedures  . MM Digital Screening    Standing Status:   Future    Standing Expiration Date:   05/02/2018    Order Specific Question:   Reason  for Exam (SYMPTOM  OR DIAGNOSIS REQUIRED)    Answer:   screen for breast canhcer    Order Specific Question:   Is the patient pregnant?    Answer:   No    Order Specific Question:   Preferred imaging location?    Answer:   [facility]  . Comprehensive metabolic panel  . CBC with Differential/Platelet  . Lipid panel  . TSH   Follow up plan: Return in about 1 year (around 03/02/2018) for complete physical.  An After Visit Summary was printed and given to the patient. VIS for Shingrix given; she will read about it and consider

## 2017-03-02 NOTE — Assessment & Plan Note (Signed)
Scattered, will likely get more; benign

## 2017-03-02 NOTE — Assessment & Plan Note (Signed)
So glad that patient is working on lifestyle changes, losing weight

## 2017-03-02 NOTE — Assessment & Plan Note (Signed)
USPSTF grade A and B recommendations reviewed with patient; age-appropriate recommendations, preventive care, screening tests, etc discussed and encouraged; healthy living encouraged; see AVS for patient education given to patient  

## 2017-03-03 LAB — CBC WITH DIFFERENTIAL/PLATELET
BASOS: 1 %
Basophils Absolute: 0 10*3/uL (ref 0.0–0.2)
EOS (ABSOLUTE): 0.2 10*3/uL (ref 0.0–0.4)
Eos: 4 %
Hematocrit: 40.2 % (ref 34.0–46.6)
Hemoglobin: 13.2 g/dL (ref 11.1–15.9)
Immature Grans (Abs): 0 10*3/uL (ref 0.0–0.1)
Immature Granulocytes: 0 %
Lymphocytes Absolute: 2.2 10*3/uL (ref 0.7–3.1)
Lymphs: 48 %
MCH: 28.6 pg (ref 26.6–33.0)
MCHC: 32.8 g/dL (ref 31.5–35.7)
MCV: 87 fL (ref 79–97)
MONOS ABS: 0.3 10*3/uL (ref 0.1–0.9)
Monocytes: 7 %
NEUTROS ABS: 1.8 10*3/uL (ref 1.4–7.0)
Neutrophils: 40 %
Platelets: 288 10*3/uL (ref 150–379)
RBC: 4.62 x10E6/uL (ref 3.77–5.28)
RDW: 14 % (ref 12.3–15.4)
WBC: 4.5 10*3/uL (ref 3.4–10.8)

## 2017-03-03 LAB — COMPREHENSIVE METABOLIC PANEL
A/G RATIO: 1.3 (ref 1.2–2.2)
ALT: 20 IU/L (ref 0–32)
AST: 23 IU/L (ref 0–40)
Albumin: 4.3 g/dL (ref 3.5–5.5)
Alkaline Phosphatase: 93 IU/L (ref 39–117)
BUN/Creatinine Ratio: 7 — ABNORMAL LOW (ref 9–23)
BUN: 6 mg/dL (ref 6–24)
Bilirubin Total: 0.4 mg/dL (ref 0.0–1.2)
CALCIUM: 9.6 mg/dL (ref 8.7–10.2)
CO2: 27 mmol/L (ref 18–29)
Chloride: 99 mmol/L (ref 96–106)
Creatinine, Ser: 0.87 mg/dL (ref 0.57–1.00)
GFR calc non Af Amer: 76 mL/min/{1.73_m2} (ref >59)
GFR, EST AFRICAN AMERICAN: 88 mL/min/{1.73_m2} (ref >59)
GLOBULIN, TOTAL: 3.2 g/dL (ref 1.5–4.5)
Glucose: 81 mg/dL (ref 65–99)
Potassium: 4.2 mmol/L (ref 3.5–5.2)
SODIUM: 141 mmol/L (ref 134–144)
TOTAL PROTEIN: 7.5 g/dL (ref 6.0–8.5)

## 2017-03-03 LAB — LIPID PANEL
Chol/HDL Ratio: 2.9 ratio (ref 0.0–4.4)
Cholesterol, Total: 206 mg/dL — ABNORMAL HIGH (ref 100–199)
HDL: 70 mg/dL (ref >39)
LDL Calculated: 116 mg/dL — ABNORMAL HIGH (ref 0–99)
Triglycerides: 101 mg/dL (ref 0–149)
VLDL CHOLESTEROL CAL: 20 mg/dL (ref 5–40)

## 2017-03-03 LAB — TSH: TSH: 1.83 u[IU]/mL (ref 0.450–4.500)

## 2018-03-06 ENCOUNTER — Encounter: Payer: Managed Care, Other (non HMO) | Admitting: Family Medicine

## 2018-07-13 ENCOUNTER — Encounter: Payer: Self-pay | Admitting: Family Medicine

## 2018-07-13 ENCOUNTER — Ambulatory Visit (INDEPENDENT_AMBULATORY_CARE_PROVIDER_SITE_OTHER): Payer: Managed Care, Other (non HMO) | Admitting: Family Medicine

## 2018-07-13 VITALS — BP 126/70 | HR 71 | Temp 97.8°F | Ht 64.0 in | Wt 223.1 lb

## 2018-07-13 DIAGNOSIS — I890 Lymphedema, not elsewhere classified: Secondary | ICD-10-CM

## 2018-07-13 DIAGNOSIS — Z23 Encounter for immunization: Secondary | ICD-10-CM | POA: Diagnosis not present

## 2018-07-13 DIAGNOSIS — Z1159 Encounter for screening for other viral diseases: Secondary | ICD-10-CM | POA: Diagnosis not present

## 2018-07-13 DIAGNOSIS — Z0000 Encounter for general adult medical examination without abnormal findings: Secondary | ICD-10-CM | POA: Diagnosis not present

## 2018-07-13 DIAGNOSIS — Z114 Encounter for screening for human immunodeficiency virus [HIV]: Secondary | ICD-10-CM | POA: Diagnosis not present

## 2018-07-13 DIAGNOSIS — E669 Obesity, unspecified: Secondary | ICD-10-CM

## 2018-07-13 NOTE — Assessment & Plan Note (Signed)
Encouraged weight loss 

## 2018-07-13 NOTE — Patient Instructions (Addendum)
Consider getting the new shingles vaccine called Shingrix; that is available for individuals 55 years of age and older, and is recommended even if you have had shingles in the past and/or already received the old shingles vaccine (Zostavax); it is a two-part series, and is available at many local pharmacies  Recombinant Zoster (Shingles) Vaccine, RZV: What You Need to Know 1. Why get vaccinated? Shingles (also called herpes zoster, or just zoster) is a painful skin rash, often with blisters. Shingles is caused by the varicella zoster virus, the same virus that causes chickenpox. After you have chickenpox, the virus stays in your body and can cause shingles later in life. You can't catch shingles from another person. However, a person who has never had chickenpox (or chickenpox vaccine) could get chickenpox from someone with shingles. A shingles rash usually appears on one side of the face or body and heals within 2 to 4 weeks. Its main symptom is pain, which can be severe. Other symptoms can include fever, headache, chills and upset stomach. Very rarely, a shingles infection can lead to pneumonia, hearing problems, blindness, brain inflammation (encephalitis), or death. For about 1 person in 5, severe pain can continue even long after the rash has cleared up. This long-lasting pain is called post-herpetic neuralgia (PHN). Shingles is far more common in people 19 years of age and older than in younger people, and the risk increases with age. It is also more common in people whose immune system is weakened because of a disease such as cancer, or by drugs such as steroids or chemotherapy. At least 1 million people a year in the [place] get shingles. 2. Shingles vaccine (recombinant) Recombinant shingles vaccine was approved by FDA in 2017 for the prevention of shingles. In clinical trials, it was more than 90% effective in preventing shingles. It can also reduce the likelihood of PHN. Two doses, 2 to  6 months apart, are recommended for adults 14 and older. This vaccine is also recommended for people who have already gotten the live shingles vaccine (Zostavax). There is no live virus in this vaccine. 3. Some people should not get this vaccine Tell your vaccine provider if you:  Have any severe, life-threatening allergies. A person who has ever had a life-threatening allergic reaction after a dose of recombinant shingles vaccine, or has a severe allergy to any component of this vaccine, may be advised not to be vaccinated. Ask your health care provider if you want information about vaccine components.  Are pregnant or breastfeeding. There is not much information about use of recombinant shingles vaccine in pregnant or nursing women. Your healthcare provider might recommend delaying vaccination.  Are not feeling well. If you have a mild illness, such as a cold, you can probably get the vaccine today. If you are moderately or severely ill, you should probably wait until you recover. Your doctor can advise you.  4. Risks of a vaccine reaction With any medicine, including vaccines, there is a chance of reactions. After recombinant shingles vaccination, a person might experience:  Pain, redness, soreness, or swelling at the site of the injection  Headache, muscle aches, fever, shivering, fatigue  In clinical trials, most people got a sore arm with mild or moderate pain after vaccination, and some also had redness and swelling where they got the shot. Some people felt tired, had muscle pain, a headache, shivering, fever, stomach pain, or nausea. About 1 out of 6 people who got recombinant zoster vaccine experienced side effects that  prevented them from doing regular activities. Symptoms went away on their own in about 2 to 3 days. Side effects were more common in younger people. You should still get the second dose of recombinant zoster vaccine even if you had one of these reactions after the first  dose. Other things that could happen after this vaccine:  People sometimes faint after medical procedures, including vaccination. Sitting or lying down for about 15 minutes can help prevent fainting and injuries caused by a fall. Tell your provider if you feel dizzy or have vision changes or ringing in the ears.  Some people get shoulder pain that can be more severe and longer-lasting than routine soreness that can follow injections. This happens very rarely.  Any medication can cause a severe allergic reaction. Such reactions to a vaccine are estimated at about 1 in a million doses, and would happen within a few minutes to a few hours after the vaccination. As with any medicine, there is a very remote chance of a vaccine causing a serious injury or death. The safety of vaccines is always being monitored. For more information, visit: http://www.aguilar.org/ 5. What if there is a serious problem? What should I look for?  Look for anything that concerns you, such as signs of a severe allergic reaction, very high fever, or unusual behavior. Signs of a severe allergic reaction can include hives, swelling of the face and throat, difficulty breathing, a fast heartbeat, dizziness, and weakness. These would usually start a few minutes to a few hours after the vaccination. What should I do?  If you think it is a severe allergic reaction or other emergency that can't wait, call 9-1-1 and get to the nearest hospital. Otherwise, call your health care provider. Afterward, the reaction should be reported to the Vaccine Adverse Event Reporting System (VAERS). Your doctor should file this report, or you can do it yourself through the VAERS web site atwww.vaers.https://www.bray.com/ by calling (850)024-6339. VAERS does not give medical advice. 6. How can I learn more?  Ask your healthcare provider. He or she can give you the vaccine package insert or suggest other sources of information.  Call your local or state  health department.  Contact the Centers for Disease Control and Prevention (CDC): ? Call 713-145-3409 (1-800-CDC-INFO) or ? Visit the CDC's website at http://hunter.com/ CDC Vaccine Information Statement (VIS) Recombinant Zoster Vaccine (11/21/2016) This information is not intended to replace advice given to you by your health care provider. Make sure you discuss any questions you have with your health care provider. Document Released: 12/06/2016 Document Revised: 12/06/2016 Document Reviewed: 12/06/2016 Elsevier Interactive Patient Education  2018 [place] Maintenance, Female Adopting a healthy lifestyle and getting preventive care can go a long way to promote health and wellness. Talk with your health care provider about what schedule of regular examinations is right for you. This is a good chance for you to check in with your provider about disease prevention and staying healthy. In between checkups, there are plenty of things you can do on your own. Experts have done a lot of research about which lifestyle changes and preventive measures are most likely to keep you healthy. Ask your health care provider for more information. Weight and diet Eat a healthy diet  Be sure to include plenty of vegetables, fruits, low-fat dairy products, and lean protein.  Do not eat a lot of foods high in solid fats, added sugars, or salt.  Get regular exercise. This is one of the most  important things you can do for your health. ? Most adults should exercise for at least 150 minutes each week. The exercise should increase your heart rate and make you sweat (moderate-intensity exercise). ? Most adults should also do strengthening exercises at least twice a week. This is in addition to the moderate-intensity exercise.  Maintain a healthy weight  Body mass index (BMI) is a measurement that can be used to identify possible weight problems. It estimates body fat based on height and weight. Your  health care provider can help determine your BMI and help you achieve or maintain a healthy weight.  For females 97 years of age and older: ? A BMI below 18.5 is considered underweight. ? A BMI of 18.5 to 24.9 is normal. ? A BMI of 25 to 29.9 is considered overweight. ? A BMI of 30 and above is considered obese.  Watch levels of cholesterol and blood lipids  You should start having your blood tested for lipids and cholesterol at 55 years of age, then have this test every 5 years.  You may need to have your cholesterol levels checked more often if: ? Your lipid or cholesterol levels are high. ? You are older than 55 years of age. ? You are at high risk for heart disease.  Cancer screening Lung Cancer  Lung cancer screening is recommended for adults 53-22 years old who are at high risk for lung cancer because of a history of smoking.  A yearly low-dose CT scan of the lungs is recommended for people who: ? Currently smoke. ? Have quit within the past 15 years. ? Have at least a 30-pack-year history of smoking. A pack year is smoking an average of one pack of cigarettes a day for 1 year.  Yearly screening should continue until it has been 15 years since you quit.  Yearly screening should stop if you develop a health problem that would prevent you from having lung cancer treatment.  Breast Cancer  Practice breast self-awareness. This means understanding how your breasts normally appear and feel.  It also means doing regular breast self-exams. Let your health care provider know about any changes, no matter how small.  If you are in your 20s or 30s, you should have a clinical breast exam (CBE) by a health care provider every 1-3 years as part of a regular health exam.  If you are 28 or older, have a CBE every year. Also consider having a breast X-ray (mammogram) every year.  If you have a family history of breast cancer, talk to your health care provider about genetic  screening.  If you are at high risk for breast cancer, talk to your health care provider about having an MRI and a mammogram every year.  Breast cancer gene (BRCA) assessment is recommended for women who have family members with BRCA-related cancers. BRCA-related cancers include: ? Breast. ? Ovarian. ? Tubal. ? Peritoneal cancers.  Results of the assessment will determine the need for genetic counseling and BRCA1 and BRCA2 testing.  Cervical Cancer Your health care provider may recommend that you be screened regularly for cancer of the pelvic organs (ovaries, uterus, and vagina). This screening involves a pelvic examination, including checking for microscopic changes to the surface of your cervix (Pap test). You may be encouraged to have this screening done every 3 years, beginning at age 83.  For women ages 54-65, health care providers may recommend pelvic exams and Pap testing every 3 years, or they may recommend the  Pap and pelvic exam, combined with testing for human papilloma virus (HPV), every 5 years. Some types of HPV increase your risk of cervical cancer. Testing for HPV may also be done on women of any age with unclear Pap test results.  Other health care providers may not recommend any screening for nonpregnant women who are considered low risk for pelvic cancer and who do not have symptoms. Ask your health care provider if a screening pelvic exam is right for you.  If you have had past treatment for cervical cancer or a condition that could lead to cancer, you need Pap tests and screening for cancer for at least 20 years after your treatment. If Pap tests have been discontinued, your risk factors (such as having a new sexual partner) need to be reassessed to determine if screening should resume. Some women have medical problems that increase the chance of getting cervical cancer. In these cases, your health care provider may recommend more frequent screening and Pap  tests.  Colorectal Cancer  This type of cancer can be detected and often prevented.  Routine colorectal cancer screening usually begins at 55 years of age and continues through 55 years of age.  Your health care provider may recommend screening at an earlier age if you have risk factors for colon cancer.  Your health care provider may also recommend using home test kits to check for hidden blood in the stool.  A small camera at the end of a tube can be used to examine your colon directly (sigmoidoscopy or colonoscopy). This is done to check for the earliest forms of colorectal cancer.  Routine screening usually begins at age 55.  Direct examination of the colon should be repeated every 5-10 years through 55 years of age. However, you may need to be screened more often if early forms of precancerous polyps or small growths are found.  Skin Cancer  Check your skin from head to toe regularly.  Tell your health care provider about any new moles or changes in moles, especially if there is a change in a mole's shape or color.  Also tell your health care provider if you have a mole that is larger than the size of a pencil eraser.  Always use sunscreen. Apply sunscreen liberally and repeatedly throughout the day.  Protect yourself by wearing long sleeves, pants, a wide-brimmed hat, and sunglasses whenever you are outside.  Heart disease, diabetes, and high blood pressure  High blood pressure causes heart disease and increases the risk of stroke. High blood pressure is more likely to develop in: ? People who have blood pressure in the high end of the normal range (130-139/85-89 mm Hg). ? People who are overweight or obese. ? People who are African American.  If you are 4-71 years of age, have your blood pressure checked every 3-5 years. If you are 11 years of age or older, have your blood pressure checked every year. You should have your blood pressure measured twice-once when you are at  a hospital or clinic, and once when you are not at a hospital or clinic. Record the average of the two measurements. To check your blood pressure when you are not at a hospital or clinic, you can use: ? An automated blood pressure machine at a pharmacy. ? A home blood pressure monitor.  If you are between 38 years and 43 years old, ask your health care provider if you should take aspirin to prevent strokes.  Have regular diabetes screenings.  This involves taking a blood sample to check your fasting blood sugar level. ? If you are at a normal weight and have a low risk for diabetes, have this test once every three years after 55 years of age. ? If you are overweight and have a high risk for diabetes, consider being tested at a younger age or more often. Preventing infection Hepatitis B  If you have a higher risk for hepatitis B, you should be screened for this virus. You are considered at high risk for hepatitis B if: ? You were born in a country where hepatitis B is common. Ask your health care provider which countries are considered high risk. ? Your parents were born in a high-risk country, and you have not been immunized against hepatitis B (hepatitis B vaccine). ? You have HIV or AIDS. ? You use needles to inject street drugs. ? You live with someone who has hepatitis B. ? You have had sex with someone who has hepatitis B. ? You get hemodialysis treatment. ? You take certain medicines for conditions, including cancer, organ transplantation, and autoimmune conditions.  Hepatitis C  Blood testing is recommended for: ? Everyone born from [birth year] through 1963. ? Anyone with known risk factors for hepatitis C.  Sexually transmitted infections (STIs)  You should be screened for sexually transmitted infections (STIs) including gonorrhea and chlamydia if: ? You are sexually active and are younger than 55 years of age. ? You are older than 55 years of age and your health care provider tells  you that you are at risk for this type of infection. ? Your sexual activity has changed since you were last screened and you are at an increased risk for chlamydia or gonorrhea. Ask your health care provider if you are at risk.  If you do not have HIV, but are at risk, it may be recommended that you take a prescription medicine daily to prevent HIV infection. This is called pre-exposure prophylaxis (PrEP). You are considered at risk if: ? You are sexually active and do not regularly use condoms or know the HIV status of your partner(s). ? You take drugs by injection. ? You are sexually active with a partner who has HIV.  Talk with your health care provider about whether you are at high risk of being infected with HIV. If you choose to begin PrEP, you should first be tested for HIV. You should then be tested every 3 months for as long as you are taking PrEP. Pregnancy  If you are premenopausal and you may become pregnant, ask your health care provider about preconception counseling.  If you may become pregnant, take 400 to 800 micrograms (mcg) of folic acid every day.  If you want to prevent pregnancy, talk to your health care provider about birth control (contraception). Osteoporosis and menopause  Osteoporosis is a disease in which the bones lose minerals and strength with aging. This can result in serious bone fractures. Your risk for osteoporosis can be identified using a bone density scan.  If you are 10 years of age or older, or if you are at risk for osteoporosis and fractures, ask your health care provider if you should be screened.  Ask your health care provider whether you should take a calcium or vitamin D supplement to lower your risk for osteoporosis.  Menopause may have certain physical symptoms and risks.  Hormone replacement therapy may reduce some of these symptoms and risks. Talk to your health care provider about whether  hormone replacement therapy is right for  you. Follow these instructions at home:  Schedule regular health, dental, and eye exams.  Stay current with your immunizations.  Do not use any tobacco products including cigarettes, chewing tobacco, or electronic cigarettes.  If you are pregnant, do not drink alcohol.  If you are breastfeeding, limit how much and how often you drink alcohol.  Limit alcohol intake to no more than 1 drink per day for nonpregnant women. One drink equals 12 ounces of beer, 5 ounces of wine, or 1 ounces of hard liquor.  Do not use street drugs.  Do not share needles.  Ask your health care provider for help if you need support or information about quitting drugs.  Tell your health care provider if you often feel depressed.  Tell your health care provider if you have ever been abused or do not feel safe at home. This information is not intended to replace advice given to you by your health care provider. Make sure you discuss any questions you have with your health care provider. Document Released: 04/11/2011 Document Revised: 03/03/2016 Document Reviewed: 06/30/2015 Elsevier Interactive Patient Education  2018 [employer].  Consider 800 to 1000 iu of vitamin D3 on days when you don't get much sun exposure  Check out the information at [employer] entitled "Nutrition for Weight Loss: What You Need to Know about Fad Diets" Try to lose between 1-2 pounds per week by taking in fewer calories and burning off more calories You can succeed by limiting portions, limiting foods dense in calories and fat, becoming more active, and drinking 8 glasses of water a day (64 ounces) Don't skip meals, especially breakfast, as skipping meals may alter your metabolism Do not use over-the-counter weight loss pills or gimmicks that claim rapid weight loss A healthy BMI (or body mass index) is between 18.5 and 24.9 You can calculate your ideal BMI at the [place] website  ClubMonetize.fr  Consider daily 81 mg aspirin for colon cancer prevention IF your GI doctor agrees

## 2018-07-13 NOTE — Progress Notes (Signed)
Patient ID: Sierra Buck, female   DOB: 1961/04/19, 57 y.o.   MRN: [id number]   Subjective:   Sierra Buck is a 57 y.o. female here for a complete physical exam  Interim issues since last visit: none  USPSTF grade A and B recommendations Depression:  Depression screen [facility] 2/9 07/13/2018 03/02/2017 08/27/2015 08/19/2015  Decreased Interest 0 0 0 0  Down, Depressed, Hopeless 0 0 0 0  PHQ - 2 Score 0 0 0 0  Altered sleeping 0 - - -  Tired, decreased energy 0 - - -  Change in appetite 0 - - -  Feeling bad or failure about yourself  0 - - -  Trouble concentrating 0 - - -  Moving slowly or fidgety/restless 0 - - -  Suicidal thoughts 0 - - -  PHQ-9 Score 0 - - -   Hypertension: BP Readings from Last 3 Encounters:  07/13/18 126/70  03/02/17 136/78  04/26/16 113/64   Obesity: Wt Readings from Last 3 Encounters:  07/13/18 223 lb 1.6 oz (101.2 kg)  03/02/17 219 lb 9.6 oz (99.6 kg)  04/26/16 210 lb (95.3 kg)   BMI Readings from Last 3 Encounters:  07/13/18 38.30 kg/m  03/02/17 37.93 kg/m  04/26/16 35.49 kg/m     Skin cancer: she has lots of moles, one on the left breast (been there for 1-2 years, same size); has a spot on the inner right thigh (red, one month or more); not sure what it is Lung cancer:  nonsmoker Breast cancer: no lumps; doing monthly SBE Colorectal cancer: 2017; found a polyp and next due July 2020 Cervical cancer screening: s/p hyst, noncancerous, just heavy bleeding BRCA gene screening: family hx of breast and/or ovarian cancer and/or metastatic prostate cancer? Mother's 1st cousin had breast cancer; father's aunt had breast cancer HIV, hep B, hep C: okay STD testing and prevention (chl/gon/syphilis): not interedsted Intimate partner violence: no abuse Contraception: n/a Osteoporosis: no Fall prevention/vitamin D: discussed, some time in the sun; not extra vit D Immunizations: discussed Diet: good eater, pescatarian for 2-3 years; lost 20  pounds, then eating more breads and pastas; not eating much, surprised at her weight loss, not faster; unsweetened tea Exercise: two 10 minute walks a day Alcohol:    Office Visit from 07/13/2018 in [facility]  AUDIT-C Score  2     Tobacco use: non AAA: n/a Aspirin:  The 10-year ASCVD risk score Mikey Bussing DC Jr., et al., 2013) is: 2.2%   Values used to calculate the score:     Age: 6 years     Sex: Female     Is Non-Hispanic African American: Yes     Diabetic: No     Tobacco smoker: No     Systolic Blood Pressure: 741 mmHg     Is BP treated: No     HDL Cholesterol: 70 mg/dL     Total Cholesterol: 206 mg/dL  Glucose:  Glucose  Date Value Ref Range Status  03/02/2017 81 65 - 99 mg/dL Final  08/19/2015 72 65 - 99 mg/dL Final   Lipids:  Lab Results  Component Value Date   CHOL 206 (H) 03/02/2017   CHOL 269 (H) 09/09/2015   Lab Results  Component Value Date   HDL 70 03/02/2017   HDL 75 09/09/2015   Lab Results  Component Value Date   LDLCALC 116 (H) 03/02/2017   LDLCALC 164 (H) 09/09/2015   Lab Results  Component Value Date  TRIG 101 03/02/2017   TRIG 148 09/09/2015   Lab Results  Component Value Date   CHOLHDL 2.9 03/02/2017   CHOLHDL 3.6 09/09/2015   No results found for: LDLDIRECT   Past Medical History:  Diagnosis Date  . Allergy   . Bilateral leg edema   . Obesity (BMI 35.0-39.9 without comorbidity) 03/02/2017  . Sleep apnea    CPAP   Past Surgical History:  Procedure Laterality Date  . ABDOMINAL HYSTERECTOMY  2006   Partial  . COLONOSCOPY WITH PROPOFOL N/A 04/26/2016   Procedure: COLONOSCOPY WITH PROPOFOL;  Surgeon: Robert Bellow, MD;  Location: [facility] ENDOSCOPY;  Service: Endoscopy;  Laterality: N/A;   Family History  Problem Relation Age of Onset  . Hypertension Mother   . Parkinson's disease Mother   . Hypertension Sister   . Hypertension Paternal Aunt        pat great aunt  . Cancer Father        colon or lung ?   . Breast cancer Paternal Aunt   . Hernia Brother   . ADD / ADHD Son   . Cancer Maternal Grandfather        lung   Social History   Tobacco Use  . Smoking status: Never Smoker  . Smokeless tobacco: Never Used  Substance Use Topics  . Alcohol use: Yes    Alcohol/week: 0.0 standard drinks    Comment: occasional  . Drug use: No   Review of Systems  Objective:   Vitals:   07/13/18 1048  BP: 126/70  Pulse: 71  Temp: 97.8 F (36.6 C)  SpO2: 97%  Weight: 223 lb 1.6 oz (101.2 kg)  Height: '5\' 4"'  (1.626 m)   Body mass index is 38.3 kg/m. Wt Readings from Last 3 Encounters:  07/13/18 223 lb 1.6 oz (101.2 kg)  03/02/17 219 lb 9.6 oz (99.6 kg)  04/26/16 210 lb (95.3 kg)   Physical Exam  Constitutional: She appears well-developed and well-nourished.  Obese, weight gain noted  HENT:  Head: Normocephalic and atraumatic.  Right Ear: Hearing, tympanic membrane, external ear and ear canal normal.  Left Ear: Hearing, tympanic membrane, external ear and ear canal normal.  Eyes: Conjunctivae and EOM are normal. Right eye exhibits no hordeolum. Left eye exhibits no hordeolum. No scleral icterus.  Neck: Carotid bruit is not present. No thyromegaly present.  Cardiovascular: Normal rate, regular rhythm, S1 normal, S2 normal and normal heart sounds.  No extrasystoles are present.  Pulmonary/Chest: Effort normal and breath sounds normal. No respiratory distress. Right breast exhibits no inverted nipple, no mass, no nipple discharge, no skin change and no tenderness. Left breast exhibits no inverted nipple, no mass, no nipple discharge, no skin change and no tenderness. Breasts are symmetrical.  Abdominal: Soft. Normal appearance and bowel sounds are normal. She exhibits no distension, no abdominal bruit, no pulsatile midline mass and no mass. There is no hepatosplenomegaly. There is no tenderness. No hernia.  Musculoskeletal: Normal range of motion. She exhibits edema (nonpitting edema both  LE).  Lymphadenopathy:       Head (right side): No submandibular adenopathy present.       Head (left side): No submandibular adenopathy present.    She has no cervical adenopathy.    She has no axillary adenopathy.  Neurological: She is alert. She displays no tremor. No cranial nerve deficit. She exhibits normal muscle tone. Gait normal.  Reflex Scores:      Patellar reflexes are 2+ on the right side  and 2+ on the left side. Skin: Skin is warm and dry. No bruising and no ecchymosis noted. No cyanosis. No pallor.  Psychiatric: Her speech is normal and behavior is normal. Thought content normal. Her mood appears not anxious. She does not exhibit a depressed mood.    Assessment/Plan:   Problem List Items Addressed This Visit      Other   Preventative health care - Primary   Relevant Orders   CBC with Differential/Platelet   Lipid panel   TSH   Comprehensive metabolic panel   Obesity (BMI 35.0-39.9 without comorbidity)    Encouraged weight loss       Other Visit Diagnoses    Need for influenza vaccination       Relevant Orders   Flu Vaccine QUAD 6+ mos PF IM (Fluarix Quad PF) (Completed)   Encounter for screening for HIV       Relevant Orders   HIV Antibody (routine testing w rflx)   Encounter for hepatitis C screening test for low risk patient       Relevant Orders   Hepatitis C antibody   Lymphedema of both lower extremities       Relevant Orders   Ambulatory referral to Vascular Surgery       No orders of the defined types were placed in this encounter.  Orders Placed This Encounter  Procedures  . Flu Vaccine QUAD 6+ mos PF IM (Fluarix Quad PF)  . Hepatitis C antibody  . HIV Antibody (routine testing w rflx)  . CBC with Differential/Platelet  . Lipid panel    Order Specific Question:   Has the patient fasted?    Answer:   Yes  . TSH  . Comprehensive metabolic panel    Order Specific Question:   Has the patient fasted?    Answer:   Yes  . Ambulatory referral  to Vascular Surgery    Referral Priority:   Routine    Referral Type:   Surgical    Referral Reason:   Specialty Services Required    Requested Specialty:   Vascular Surgery    Number of Visits Requested:   1    Follow up plan: Return in about 1 year (around 07/14/2019) for complete physical.  An After Visit Summary was printed and given to the patient.

## 2018-07-14 LAB — TSH: TSH: 2.04 u[IU]/mL (ref 0.450–4.500)

## 2018-07-14 LAB — COMPREHENSIVE METABOLIC PANEL
A/G RATIO: 1.6 (ref 1.2–2.2)
ALBUMIN: 4.5 g/dL (ref 3.5–5.5)
ALK PHOS: 93 IU/L (ref 39–117)
ALT: 16 IU/L (ref 0–32)
AST: 18 IU/L (ref 0–40)
BILIRUBIN TOTAL: 0.3 mg/dL (ref 0.0–1.2)
BUN / CREAT RATIO: 15 (ref 9–23)
BUN: 12 mg/dL (ref 6–24)
CO2: 25 mmol/L (ref 20–29)
CREATININE: 0.8 mg/dL (ref 0.57–1.00)
Calcium: 9.8 mg/dL (ref 8.7–10.2)
Chloride: 100 mmol/L (ref 96–106)
GFR calc Af Amer: 97 mL/min/{1.73_m2} (ref >59)
GFR calc non Af Amer: 84 mL/min/{1.73_m2} (ref >59)
GLOBULIN, TOTAL: 2.9 g/dL (ref 1.5–4.5)
Glucose: 83 mg/dL (ref 65–99)
Potassium: 3.9 mmol/L (ref 3.5–5.2)
SODIUM: 141 mmol/L (ref 134–144)
Total Protein: 7.4 g/dL (ref 6.0–8.5)

## 2018-07-14 LAB — CBC WITH DIFFERENTIAL/PLATELET
BASOS: 1 %
Basophils Absolute: 0 10*3/uL (ref 0.0–0.2)
EOS (ABSOLUTE): 0.2 10*3/uL (ref 0.0–0.4)
Eos: 5 %
Hematocrit: 39.7 % (ref 34.0–46.6)
Hemoglobin: 12.7 g/dL (ref 11.1–15.9)
Immature Grans (Abs): 0 10*3/uL (ref 0.0–0.1)
Immature Granulocytes: 0 %
Lymphocytes Absolute: 2.4 10*3/uL (ref 0.7–3.1)
Lymphs: 50 %
MCH: 27.7 pg (ref 26.6–33.0)
MCHC: 32 g/dL (ref 31.5–35.7)
MCV: 87 fL (ref 79–97)
MONOS ABS: 0.3 10*3/uL (ref 0.1–0.9)
Monocytes: 7 %
NEUTROS ABS: 1.7 10*3/uL (ref 1.4–7.0)
Neutrophils: 37 %
PLATELETS: 300 10*3/uL (ref 150–450)
RBC: 4.59 x10E6/uL (ref 3.77–5.28)
RDW: 13 % (ref 12.3–15.4)
WBC: 4.7 10*3/uL (ref 3.4–10.8)

## 2018-07-14 LAB — LIPID PANEL
Chol/HDL Ratio: 3.7 ratio (ref 0.0–4.4)
Cholesterol, Total: 249 mg/dL — ABNORMAL HIGH (ref 100–199)
HDL: 68 mg/dL (ref >39)
LDL CALC: 156 mg/dL — AB (ref 0–99)
Triglycerides: 127 mg/dL (ref 0–149)
VLDL Cholesterol Cal: 25 mg/dL (ref 5–40)

## 2018-07-14 LAB — HEPATITIS C ANTIBODY

## 2018-07-14 LAB — HIV ANTIBODY (ROUTINE TESTING W REFLEX): HIV Screen 4th Generation wRfx: NONREACTIVE

## 2018-07-23 ENCOUNTER — Encounter (INDEPENDENT_AMBULATORY_CARE_PROVIDER_SITE_OTHER): Payer: Managed Care, Other (non HMO) | Admitting: Nurse Practitioner

## 2018-07-30 ENCOUNTER — Ambulatory Visit (INDEPENDENT_AMBULATORY_CARE_PROVIDER_SITE_OTHER): Payer: Managed Care, Other (non HMO) | Admitting: Nurse Practitioner

## 2018-07-30 ENCOUNTER — Encounter (INDEPENDENT_AMBULATORY_CARE_PROVIDER_SITE_OTHER): Payer: Self-pay | Admitting: Nurse Practitioner

## 2018-07-30 VITALS — BP 146/86 | HR 53 | Resp 18 | Ht 64.5 in | Wt 218.0 lb

## 2018-07-30 DIAGNOSIS — E669 Obesity, unspecified: Secondary | ICD-10-CM

## 2018-07-30 DIAGNOSIS — I872 Venous insufficiency (chronic) (peripheral): Secondary | ICD-10-CM

## 2018-07-30 NOTE — Progress Notes (Signed)
Subjective:    Patient ID: Sierra Buck, female    DOB: [birth year], 57 y.o.   MRN: [id number] Chief Complaint  Patient presents with  . New Patient (Initial Visit)    Lymphedema    HPI  Sierra Buck is a 57 y.o. female that presents today on recommendation from her primary care Dr. Sanda Klein for her bilateral edema.  She states that she has had edema since she is been approximately 57 years old, previously it was pitting now it is more soft.  She is tried Lasix in the past however it only worked for approximately a month or so, therefore she does not take it currently.  She generally wears compression stockings, however she has not worn them recently due to the fact that she has not been able to find a new supplier.  She notes that the swelling is better in the morning and worse in the evening.  She notes that the swelling is better with elevation and extended exercise.  She states that she had an ultrasound approximately 15 years ago where she was told that she had a" leaky veins".  She denies any pain with her swelling.  She does endorse that sometimes her legs feel heavy at the end of the day.  There is no history of ulcerations associated with the swelling.   The patient denies any recent changes in their medications.  The patient has not been wearing graduated compression.  The patient has no had any past angiography, interventions or vascular surgery.  The patient denies a history of DVT or PE. There is no prior history of phlebitis. There is no history of primary lymphedema.  There is no history of radiation treatment to the groin or pelvis No history of malignancies. No history of trauma or groin or pelvic surgery. No history of foreign travel or parasitic infections area    Past Medical History:  Diagnosis Date  . Allergy   . Bilateral leg edema   . Lymphedema of both lower extremities   . Obesity (BMI 35.0-39.9 without comorbidity) 03/02/2017  . Sleep apnea    CPAP    Past Surgical History:  Procedure Laterality Date  . ABDOMINAL HYSTERECTOMY  2006   Partial  . COLONOSCOPY WITH PROPOFOL N/A 04/26/2016   Procedure: COLONOSCOPY WITH PROPOFOL;  Surgeon: Robert Bellow, MD;  Location[facility] ENDOSCOPY;  Service: Endoscopy;  Laterality: N/A;    Social History   Socioeconomic History  . Marital status: Married    Spouse name: Jerline Pain  . Number of children: 2  . Years of education: 74  . Highest education level: Master's degree (e.g., MA, MS, MEng, MEd, MSW, MBA)  Occupational History  . Not on file  Social Needs  . Financial resource strain: Not hard at all  . Food insecurity:    Worry: Never true    Inability: Never true  . Transportation needs:    Medical: No    Non-medical: No  Tobacco Use  . Smoking status: Never Smoker  . Smokeless tobacco: Never Used  Substance and Sexual Activity  . Alcohol use: Yes    Alcohol/week: 0.0 standard drinks    Comment: occasional  . Drug use: No  . Sexual activity: Yes    Partners: Male  Lifestyle  . Physical activity:    Days per week: 4 days    Minutes per session: 20 min  . Stress: Not at all  Relationships  . Social connections:    Talks on  phone: More than three times a week    Gets together: Three times a week    Attends religious service: More than 4 times per year    Active member of club or organization: No    Attends meetings of clubs or organizations: Never    Relationship status: Married  . Intimate partner violence:    Fear of current or ex partner: No    Emotionally abused: No    Physically abused: No    Forced sexual activity: No  Other Topics Concern  . Not on file  Social History Narrative  . Not on file    Family History  Problem Relation Age of Onset  . Hypertension Mother   . Parkinson's disease Mother   . Hypertension Sister   . Hypertension Paternal Aunt        pat great aunt  . Cancer Father        colon or lung ?  . Breast cancer Paternal Aunt   .  Hernia Brother   . ADD / ADHD Son   . Cancer Maternal Grandfather        lung    No Known Allergies   Review of Systems   Review of Systems: Negative Unless Checked Constitutional: [] Weight loss  [] Fever  [] Chills Cardiac: [] Chest pain   []  Atrial Fibrillation  [] Palpitations   [] Shortness of breath when laying flat   [] Shortness of breath with exertion. Vascular:  [] Pain in legs with walking   [] Pain in legs with standing  [] History of DVT   [] Phlebitis   [x] Swelling in legs   [] Varicose veins   [] Non-healing ulcers Pulmonary:   [] Uses home oxygen   [] Productive cough   [] Hemoptysis   [] Wheeze  [] COPD   [] Asthma Neurologic:  [] Dizziness   [] Seizures   [] History of stroke   [] History of TIA  [] Aphasia   [] Vissual changes   [] Weakness or numbness in arm   [] Weakness or numbness in leg Musculoskeletal:   [] Joint swelling   [] Joint pain   [] Low back pain  []  History of Knee Replacement Hematologic:  [] Easy bruising  [] Easy bleeding   [] Hypercoagulable state   [] Anemic Gastrointestinal:  [] Diarrhea   [] Vomiting  [] Gastroesophageal reflux/heartburn   [] Difficulty swallowing. Genitourinary:  [] Chronic kidney disease   [] Difficult urination  [] Anuric   [] Blood in urine Skin:  [] Rashes   [] Ulcers  Psychological:  [] History of anxiety   []  History of major depression  []  Memory Difficulties     Objective:   Physical Exam  BP (!) 146/86 (BP Location: Right Arm, Patient Position: Sitting)   Pulse (!) 53   Resp 18   Ht 5' 4.5" (1.638 m)   Wt 218 lb (98.9 kg)   BMI 36.84 kg/m   Gen: WD/WN, NAD Head: [place]/AT, No temporalis wasting.  Ear/Nose/Throat: Hearing grossly intact, nares w/o erythema or drainage Eyes: PER, EOMI, sclera nonicteric.  Neck: Supple, no masses.  No JVD.  Pulmonary:  Good air movement, no use of accessory muscles.  Cardiac: RRR Vascular:  2+ soft edema bilaterally, including ankles and feet Vessel Right Left  Radial Palpable Palpable  Dorsalis Pedis Palpable Palpable    Posterior Tibial Palpable Palpable   Gastrointestinal: soft, non-distended. No guarding/no peritoneal signs.  Musculoskeletal: M/S 5/5 throughout.  No deformity or atrophy.  Neurologic: Pain and light touch intact in extremities.  Symmetrical.  Speech is fluent. Motor exam as listed above. Psychiatric: Judgment intact, Mood & affect appropriate for pt's clinical situation. Dermatologic: No  Venous rashes. No Ulcers Noted.  No changes consistent with cellulitis. Lymph : No Cervical lymphadenopathy, Dermal Thickening      Assessment & Plan:   1. Chronic venous insufficiency  Recommend:  The patient has large symptomatic varicose veins that are painful and associated with swelling.  I have had a long discussion with the patient regarding  varicose veins and why they cause symptoms.  Patient will begin wearing graduated compression stockings class 1 on a daily basis, beginning first thing in the morning and removing them in the evening. The patient is instructed specifically not to sleep in the stockings.    The patient  will also begin using over-the-counter analgesics such as Motrin 600 mg po TID to help control the symptoms.    In addition, behavioral modification including elevation during the day will be initiated.    Pending the results of these changes the  patient will be reevaluated in three months.   An  ultrasound of the venous system will be obtained.   Further plans will be based on the ultrasound results and whether conservative therapies are successful at eliminating the pain and swelling.   2. Obesity (BMI 35.0-39.9 without comorbidity) She is currently working on conservative methods for weight loss.  Currently being followed by her primary care physician.    Current Outpatient Medications on File Prior to Visit  Medication Sig Dispense Refill  . triamterene-hydrochlorothiazide (MAXZIDE-25) 37.5-25 MG tablet Take 1 tablet by mouth daily. (Patient not taking: Reported  on 07/30/2018) 90 tablet 0   No current facility-administered medications on file prior to visit.     There are no Patient Instructions on file for this visit. No follow-ups on file.   Kris Hartmann, NP  This note was completed with Sales executive.  Any errors are purely unintentional.

## 2018-10-30 ENCOUNTER — Other Ambulatory Visit (INDEPENDENT_AMBULATORY_CARE_PROVIDER_SITE_OTHER): Payer: Self-pay | Admitting: Nurse Practitioner

## 2018-10-30 ENCOUNTER — Other Ambulatory Visit (INDEPENDENT_AMBULATORY_CARE_PROVIDER_SITE_OTHER): Payer: Self-pay | Admitting: Vascular Surgery

## 2018-10-30 DIAGNOSIS — I83893 Varicose veins of bilateral lower extremities with other complications: Secondary | ICD-10-CM

## 2018-10-30 DIAGNOSIS — I872 Venous insufficiency (chronic) (peripheral): Secondary | ICD-10-CM

## 2018-10-30 DIAGNOSIS — I83813 Varicose veins of bilateral lower extremities with pain: Secondary | ICD-10-CM

## 2018-11-01 ENCOUNTER — Encounter (INDEPENDENT_AMBULATORY_CARE_PROVIDER_SITE_OTHER): Payer: Managed Care, Other (non HMO)

## 2018-11-01 ENCOUNTER — Ambulatory Visit (INDEPENDENT_AMBULATORY_CARE_PROVIDER_SITE_OTHER): Payer: Managed Care, Other (non HMO) | Admitting: Vascular Surgery

## 2018-11-28 ENCOUNTER — Encounter (INDEPENDENT_AMBULATORY_CARE_PROVIDER_SITE_OTHER): Payer: Self-pay | Admitting: Vascular Surgery

## 2019-04-29 ENCOUNTER — Encounter: Payer: Self-pay | Admitting: General Surgery

## 2019-05-07 ENCOUNTER — Ambulatory Visit: Payer: Managed Care, Other (non HMO) | Admitting: General Surgery

## 2019-05-08 ENCOUNTER — Encounter: Payer: Self-pay | Admitting: *Deleted

## 2019-06-07 ENCOUNTER — Ambulatory Visit (INDEPENDENT_AMBULATORY_CARE_PROVIDER_SITE_OTHER): Payer: Managed Care, Other (non HMO) | Admitting: Family Medicine

## 2019-06-07 ENCOUNTER — Other Ambulatory Visit: Payer: Self-pay

## 2019-06-07 ENCOUNTER — Encounter: Payer: Self-pay | Admitting: Family Medicine

## 2019-06-07 VITALS — HR 81

## 2019-06-07 DIAGNOSIS — M545 Low back pain, unspecified: Secondary | ICD-10-CM

## 2019-06-07 DIAGNOSIS — Z87448 Personal history of other diseases of urinary system: Secondary | ICD-10-CM

## 2019-06-07 DIAGNOSIS — Z87898 Personal history of other specified conditions: Secondary | ICD-10-CM

## 2019-06-07 NOTE — Progress Notes (Signed)
Name: Sierra Buck   MRN: [id number]    DOB: [birth year]   Date:06/10/2019       Progress Note  Subjective  Chief Complaint  Chief Complaint  Patient presents with  . Back Pain    Onset-3 days, lower back has tried Ibuprofen with no relief-has been nausea and pressure when she goes to the bathroom    I connected with  [place]  on 06/10/19 at 12:40 PM EDT by a video enabled telemedicine application and verified that I am speaking with the correct person using two identifiers.  I discussed the limitations of evaluation and management by telemedicine and the availability of in person appointments. The patient expressed understanding and agreed to proceed. Staff also discussed with the patient that there may be a patient responsible charge related to this service. Patient Location: Musician (parked) Provider Location: Office Additional Individuals present: None  HPI  Pt presnets with concern for low back pain that started about 5 days ago, she has not had any symptoms for about 2 days now.  She was having low back pain with pressure and feeling like she needed to urinate more frequently.  All of this improved with ibuprofen, she has been asymptomatic for 2 days now.  She does note UTI with hematuria about 2 months ago.  She does not have a known history kidney stones.  Denies fevers/chills, back pain, abdominal pain, flank pain, nausea/vomiting.    Patient Active Problem List   Diagnosis Date Noted  . Preventative health care 03/02/2017  . Obesity (BMI 35.0-39.9 without comorbidity) 03/02/2017  . Seborrheic keratoses 03/02/2017  . Encounter for screening colonoscopy 03/10/2016    Social History   Tobacco Use  . Smoking status: Never Smoker  . Smokeless tobacco: Never Used  Substance Use Topics  . Alcohol use: Yes    Alcohol/week: 0.0 standard drinks    Comment: occasional     Current Outpatient Medications:  .  triamterene-hydrochlorothiazide (MAXZIDE-25) 37.5-25 MG  tablet, Take 1 tablet by mouth daily. (Patient not taking: Reported on 07/30/2018), Disp: 90 tablet, Rfl: 0  No Known Allergies  I personally reviewed active problem list, medication list, allergies, notes from last encounter, lab results with the patient/caregiver today.  ROS  Constitutional: Negative for fever or weight change.  Respiratory: Negative for cough and shortness of breath.   Cardiovascular: Negative for chest pain or palpitations.  Gastrointestinal: Negative for abdominal pain, no bowel changes.  Musculoskeletal: Negative for gait problem or joint swelling.  Skin: Negative for rash.  Neurological: Negative for dizziness or headache.  No other specific complaints in a complete review of systems (except as listed in HPI above).  Objective  Virtual encounter, vitals not obtained.  There is no height or weight on file to calculate BMI.  Nursing Note and Vital Signs reviewed.  Physical Exam  Constitutional: Patient appears well-developed and well-nourished. No distress.  HENT: Head: Normocephalic and atraumatic.  Neck: Normal range of motion. Pulmonary/Chest: Effort normal. No respiratory distress. Speaking in complete sentences Neurological: Pt is alert and oriented to person, place, and time. Coordination, speech are normal.  Psychiatric: Patient has a normal mood and affect. behavior is normal. Judgment and thought content normal.  No results found for this or any previous visit (from the past 72 hour(s)).  Assessment & Plan  1. History of gross hematuria - POCT urinalysis dipstick - Urine Culture  2. Acute bilateral low back pain without sciatica - POCT urinalysis dipstick - Urine Culture  Question if patient has passed kidney stone based on course of symptoms and history 2 months ago of small amount of frank hematuria.  Will consider urology referral vs KUB based on UA/culture  -Red flags and when to present for emergency care or RTC including fever  >101.86F, chest pain, shortness of breath, new/worsening/un-resolving symptoms,  reviewed with patient at time of visit. Follow up and care instructions discussed and provided in AVS. - I discussed the assessment and treatment plan with the patient. The patient was provided an opportunity to ask questions and all were answered. The patient agreed with the plan and demonstrated an understanding of the instructions.  I provided 10 minutes of non-face-to-face time during this encounter.  Hubbard Hartshorn, FNP

## 2019-06-08 LAB — URINE CULTURE
MICRO NUMBER:: 824465
SPECIMEN QUALITY:: ADEQUATE

## 2019-08-06 ENCOUNTER — Other Ambulatory Visit: Payer: Self-pay

## 2019-08-06 ENCOUNTER — Encounter: Payer: Self-pay | Admitting: Family Medicine

## 2019-08-06 ENCOUNTER — Ambulatory Visit (INDEPENDENT_AMBULATORY_CARE_PROVIDER_SITE_OTHER): Payer: Managed Care, Other (non HMO) | Admitting: Family Medicine

## 2019-08-06 VITALS — BP 118/80 | HR 88 | Temp 97.6°F | Resp 16 | Ht 65.0 in | Wt 218.0 lb

## 2019-08-06 DIAGNOSIS — E7849 Other hyperlipidemia: Secondary | ICD-10-CM

## 2019-08-06 DIAGNOSIS — Z131 Encounter for screening for diabetes mellitus: Secondary | ICD-10-CM

## 2019-08-06 DIAGNOSIS — Z0000 Encounter for general adult medical examination without abnormal findings: Secondary | ICD-10-CM

## 2019-08-06 NOTE — Progress Notes (Signed)
Name: Sierra Buck   MRN: [id number]    DOB: 1961-09-25   Date:08/06/2019       Progress Note  Subjective  Chief Complaint  Chief Complaint  Patient presents with  . Annual Exam    HPI  Patient presents for annual CPE.  Diet: She reports having a difficult time with her diet, she has tried to give up red meats and pork. She is eating mostly fish but feels limited.   Exercise: She has purchased a Nurse, adult but has not been able to use it due to a bad Internet issue in her area. She walks occasionally and stretches, she works from home.   USPSTF grade A and B recommendations    Office Visit from 08/06/2019 in [facility]  AUDIT-C Score  0     Depression: Phq 9 is  negative Depression screen [facility] 2/9 08/06/2019 06/07/2019 07/13/2018 03/02/2017 08/27/2015  Decreased Interest 0 0 0 0 0  Down, Depressed, Hopeless 0 0 0 0 0  PHQ - 2 Score 0 0 0 0 0  Altered sleeping 0 0 0 - -  Tired, decreased energy 0 0 0 - -  Change in appetite 0 0 0 - -  Feeling bad or failure about yourself  0 0 0 - -  Trouble concentrating 0 0 0 - -  Moving slowly or fidgety/restless 0 0 0 - -  Suicidal thoughts 0 0 0 - -  PHQ-9 Score 0 0 0 - -  Difficult doing work/chores Not difficult at all Not difficult at all - - -   Hypertension: BP Readings from Last 3 Encounters:  08/06/19 118/80  07/30/18 (!) 146/86  07/13/18 126/70   Obesity: Wt Readings from Last 3 Encounters:  08/06/19 98.9 kg  07/30/18 98.9 kg  07/13/18 101.2 kg   BMI Readings from Last 3 Encounters:  08/06/19 36.28 kg/m  07/30/18 36.84 kg/m  07/13/18 38.30 kg/m     Hep C Screening:results 2019 negative STD testing and prevention (HIV/chl/gon/syphilis): Declines today Intimate partner violence:no concerns Sexual History/Pain during Intercourse: no concerns Menstrual History/LMP/Abnormal Bleeding: Had hysterectomy many years ago; had cervix removed per her report has not required additional Paps and no  vaginal bleeding. Incontinence Symptoms: no concerns today   Breast cancer:  - Last Mammogram: Provided information for mammogram screening - due for this today - BRCA gene screening: denies familial history  Osteoporosis Screening:Provided information for Bone Density screening; discussed high calcium/vitamin D diet  Cervical cancer screening: hx of partial hysterectomy w/ cervix removal.  Skin cancer: no history; discussed monitoring or atypical lesions. Colorectal cancer: Last colonoscopy 2017 with negative results will return in 2021.  Lung cancer: Low Dose CT Chest recommended if Age 49-80 years, 30 pack-year currently smoking OR have quit w/in 15years. Patient does not qualify.   XBM:WUXLKG chest pain, shortness of breath, or palpitations  Advanced Care Planning: A voluntary discussion about advance care planning including the explanation and discussion of advance directives.  Discussed health care proxy and Living will, and the patient was able to identify a health care proxy as Tyson Dense.  Patient does not have a living will at present time. If patient does have living will, I have requested they bring this to the clinic to be scanned in to their chart.  Lipids: Lab Results  Component Value Date   CHOL 249 (H) 07/13/2018   CHOL 206 (H) 03/02/2017   CHOL 269 (H) 09/09/2015   Lab Results  Component  Value Date   HDL 68 07/13/2018   HDL 70 03/02/2017   HDL 75 09/09/2015   Lab Results  Component Value Date   LDLCALC 156 (H) 07/13/2018   LDLCALC 116 (H) 03/02/2017   LDLCALC 164 (H) 09/09/2015   Lab Results  Component Value Date   TRIG 127 07/13/2018   TRIG 101 03/02/2017   TRIG 148 09/09/2015   Lab Results  Component Value Date   CHOLHDL 3.7 07/13/2018   CHOLHDL 2.9 03/02/2017   CHOLHDL 3.6 09/09/2015   No results found for: LDLDIRECT  Glucose: Glucose  Date Value Ref Range Status  07/13/2018 83 65 - 99 mg/dL Final  03/02/2017 81 65 - 99 mg/dL Final   08/19/2015 72 65 - 99 mg/dL Final    Patient Active Problem List   Diagnosis Date Noted  . Preventative health care 03/02/2017  . Obesity (BMI 35.0-39.9 without comorbidity) 03/02/2017  . Seborrheic keratoses 03/02/2017  . Encounter for screening colonoscopy 03/10/2016    Past Surgical History:  Procedure Laterality Date  . ABDOMINAL HYSTERECTOMY  2006   Partial  . COLONOSCOPY WITH PROPOFOL N/A 04/26/2016   Procedure: COLONOSCOPY WITH PROPOFOL;  Surgeon: Robert Bellow, MD;  Location: [facility] ENDOSCOPY;  Service: Endoscopy;  Laterality: N/A;    Family History  Problem Relation Age of Onset  . Hypertension Mother   . Parkinson's disease Mother   . Hypertension Sister   . Hypertension Paternal Aunt        pat great aunt  . Cancer Father        colon or lung ?  . Breast cancer Paternal Aunt   . Hernia Brother   . ADD / ADHD Son   . Cancer Maternal Grandfather        lung    Social History   Socioeconomic History  . Marital status: Married    Spouse name: Jerline Pain  . Number of children: 2  . Years of education: 80  . Highest education level: Master's degree (e.g., MA, MS, MEng, MEd, MSW, MBA)  Occupational History  . Not on file  Social Needs  . Financial resource strain: Not hard at all  . Food insecurity    Worry: Never true    Inability: Never true  . Transportation needs    Medical: No    Non-medical: No  Tobacco Use  . Smoking status: Never Smoker  . Smokeless tobacco: Never Used  Substance and Sexual Activity  . Alcohol use: Yes    Alcohol/week: 0.0 standard drinks    Comment: occasional  . Drug use: No  . Sexual activity: Yes    Partners: Male  Lifestyle  . Physical activity    Days per week: 3 days    Minutes per session: 30 min  . Stress: Not at all  Relationships  . Social connections    Talks on phone: More than three times a week    Gets together: Three times a week    Attends religious service: More than 4 times per year    Active  member of club or organization: No    Attends meetings of clubs or organizations: Never    Relationship status: Married  . Intimate partner violence    Fear of current or ex partner: No    Emotionally abused: No    Physically abused: No    Forced sexual activity: No  Other Topics Concern  . Not on file  Social History Narrative  . Not on file  Current Outpatient Medications:  .  triamterene-hydrochlorothiazide (MAXZIDE-25) 37.5-25 MG tablet, Take 1 tablet by mouth daily. (Patient not taking: Reported on 07/30/2018), Disp: 90 tablet, Rfl: 0  No Known Allergies   Review of Systems  Constitutional: Negative.   HENT: Negative.   Eyes: Negative.   Respiratory: Negative.   Cardiovascular: Positive for leg swelling. Negative for chest pain and palpitations.  Gastrointestinal: Negative.   Genitourinary: Negative.   Musculoskeletal: Negative.   Skin: Negative.   Neurological: Negative.   Endo/Heme/Allergies: Negative.   Psychiatric/Behavioral: Negative.   All other systems reviewed and are negative.     Objective  Vitals:   08/06/19 1030  BP: 118/80  Pulse: 88  Resp: 16  Temp: 97.6 F (36.4 C)  TempSrc: Oral  SpO2: 94%  Weight: 98.9 kg  Height: '5\' 5"'  (1.651 m)    Body mass index is 36.28 kg/m.  Physical Exam Vitals signs reviewed.  Constitutional:      General: She is not in acute distress.    Appearance: Normal appearance.  HENT:     Head: Normocephalic and atraumatic.     Right Ear: Tympanic membrane and ear canal normal.     Left Ear: Tympanic membrane and ear canal normal.     Nose: Nose normal. No congestion.     Mouth/Throat:     Mouth: Mucous membranes are moist.     Pharynx: Oropharynx is clear. No posterior oropharyngeal erythema.  Eyes:     Pupils: Pupils are equal, round, and reactive to light.  Neck:     Musculoskeletal: Normal range of motion and neck supple. No neck rigidity or muscular tenderness.  Cardiovascular:     Rate and  Rhythm: Normal rate and regular rhythm.     Pulses: Normal pulses.     Heart sounds: No murmur. No friction rub. No gallop.   Pulmonary:     Effort: Pulmonary effort is normal. No respiratory distress.     Breath sounds: Normal breath sounds. No wheezing.  Chest:     Breasts: Breasts are symmetrical.        Right: Normal. No inverted nipple, mass, nipple discharge, skin change or tenderness.        Left: Normal. No inverted nipple, mass, nipple discharge, skin change or tenderness.  Abdominal:     General: There is no distension.     Palpations: Abdomen is soft.     Tenderness: There is no abdominal tenderness. There is no guarding.  Musculoskeletal:        General: No tenderness.     Right lower leg: No edema.     Left lower leg: No edema.  Skin:    General: Skin is warm and dry.  Neurological:     General: No focal deficit present.     Mental Status: She is alert.  Psychiatric:        Mood and Affect: Mood normal.        Thought Content: Thought content normal.        Judgment: Judgment normal.    Recent Results (from the past 2160 hour(s))  Urine Culture     Status: None   Collection Time: 06/07/19 12:00 AM   Specimen: Urine  Result Value Ref Range   MICRO NUMBER: [id number]    SPECIMEN QUALITY: Adequate    Sample Source URINE, CLEAN CATCH    STATUS: FINAL    Result:      Three or more organisms present, each greater than 10,000 CFU/mL. May  represent normal flora contamination from external genitalia. No further testing is required.      Fall Risk: Fall Risk  08/06/2019 06/07/2019 07/13/2018 03/02/2017 08/27/2015  Falls in the past year? 0 0 No No No  Number falls in past yr: 0 0 - - -  Injury with Fall? - 0 - - -  Comment - - - - -  Risk for fall due to : - - - - -  Risk for fall due to: Comment - - - - -  Follow up Falls evaluation completed - - - -     Assessment & Plan  1. Well woman exam (no gynecological exam)  -USPSTF grade A and B recommendations  reviewed with patient; age-appropriate recommendations, preventive care, screening tests, etc discussed and encouraged; healthy living encouraged; see AVS for patient education given to patient -Discussed importance of 150 minutes of physical activity weekly, eat two servings of fish weekly, eat one serving of tree nuts ( cashews, pistachios, pecans, almonds.Marland Kitchen) every other day, eat 6 servings of fruit/vegetables daily and drink plenty of water and avoid sweet beverages.  -Continue to monitor diet along with increase exercise.   2. Other hyperlipidemia  - Lipid Profile  3. Diabetes mellitus screening  - Comprehensive Metabolic Panel (CMET) -Reviewed Health Maintenance: Will call to schedule DEXA and Mammogram after our visit.

## 2019-08-07 LAB — COMPREHENSIVE METABOLIC PANEL
ALT: 22 IU/L (ref 0–32)
AST: 22 IU/L (ref 0–40)
Albumin/Globulin Ratio: 1.5 (ref 1.2–2.2)
Albumin: 4.5 g/dL (ref 3.8–4.9)
Alkaline Phosphatase: 102 IU/L (ref 39–117)
BUN/Creatinine Ratio: 15 (ref 9–23)
BUN: 12 mg/dL (ref 6–24)
Bilirubin Total: 0.2 mg/dL (ref 0.0–1.2)
CO2: 28 mmol/L (ref 20–29)
Calcium: 9.9 mg/dL (ref 8.7–10.2)
Chloride: 102 mmol/L (ref 96–106)
Creatinine, Ser: 0.78 mg/dL (ref 0.57–1.00)
GFR calc Af Amer: 99 mL/min/{1.73_m2} (ref >59)
GFR calc non Af Amer: 86 mL/min/{1.73_m2} (ref >59)
Globulin, Total: 3 g/dL (ref 1.5–4.5)
Glucose: 88 mg/dL (ref 65–99)
Potassium: 4.2 mmol/L (ref 3.5–5.2)
Sodium: 141 mmol/L (ref 134–144)
Total Protein: 7.5 g/dL (ref 6.0–8.5)

## 2019-08-07 LAB — LIPID PANEL
Chol/HDL Ratio: 3.4 ratio (ref 0.0–4.4)
Cholesterol, Total: 238 mg/dL — ABNORMAL HIGH (ref 100–199)
HDL: 71 mg/dL (ref >39)
LDL Chol Calc (NIH): 151 mg/dL — ABNORMAL HIGH (ref 0–99)
Triglycerides: 95 mg/dL (ref 0–149)
VLDL Cholesterol Cal: 16 mg/dL (ref 5–40)

## 2021-06-07 ENCOUNTER — Encounter: Payer: Self-pay | Admitting: Family Medicine

## 2021-06-07 ENCOUNTER — Ambulatory Visit (INDEPENDENT_AMBULATORY_CARE_PROVIDER_SITE_OTHER): Payer: Managed Care, Other (non HMO) | Admitting: Family Medicine

## 2021-06-07 ENCOUNTER — Other Ambulatory Visit: Payer: Self-pay

## 2021-06-07 VITALS — BP 132/84 | HR 80 | Temp 98.1°F | Ht 65.0 in | Wt 245.1 lb

## 2021-06-07 DIAGNOSIS — Z23 Encounter for immunization: Secondary | ICD-10-CM | POA: Diagnosis not present

## 2021-06-07 DIAGNOSIS — E669 Obesity, unspecified: Secondary | ICD-10-CM

## 2021-06-07 DIAGNOSIS — Z1211 Encounter for screening for malignant neoplasm of colon: Secondary | ICD-10-CM | POA: Diagnosis not present

## 2021-06-07 DIAGNOSIS — E2839 Other primary ovarian failure: Secondary | ICD-10-CM | POA: Diagnosis not present

## 2021-06-07 DIAGNOSIS — Z1231 Encounter for screening mammogram for malignant neoplasm of breast: Secondary | ICD-10-CM

## 2021-06-07 DIAGNOSIS — Z6841 Body Mass Index (BMI) 40.0 and over, adult: Secondary | ICD-10-CM

## 2021-06-07 NOTE — Assessment & Plan Note (Addendum)
Currently without comorbidity. Check labs. Interested in weight management program but wants to try consistent lifestyle modifications first.

## 2021-06-07 NOTE — Progress Notes (Signed)
BP 132/84   Pulse 80   Temp 98.1 F (36.7 C)   Ht '5\' 5"'$  (1.651 m)   Wt 245 lb 1.6 oz (111.2 kg)   SpO2 97%   BMI 40.79 kg/m    Subjective:    Patient ID: Sierra Buck, female    DOB: 1961-05-07, 60 y.o.   MRN: [id number]  HPI: Sierra Buck is a 60 y.o. female presenting on 06/07/2021 for comprehensive medical examination. Current medical complaints include:none  OBESITY - weight gain during COVID pandemic - Meds: none - Previously on few different meds in the past - Complications of obesity: none - Peak weight: 245lb - Weight loss to date: 0lb - Diet: portion control - Exercise: walking   She currently lives with: husband Menopausal Symptoms:  only occasionally  Depression Screen done today and results listed below:  Depression screen [facility] 2/9 06/07/2021 08/06/2019 06/07/2019 07/13/2018 03/02/2017  Decreased Interest 0 0 0 0 0  Down, Depressed, Hopeless 1 0 0 0 0  PHQ - 2 Score 1 0 0 0 0  Altered sleeping 0 0 0 0 -  Tired, decreased energy 0 0 0 0 -  Change in appetite 0 0 0 0 -  Feeling bad or failure about yourself  0 0 0 0 -  Trouble concentrating 0 0 0 0 -  Moving slowly or fidgety/restless 0 0 0 0 -  Suicidal thoughts 0 0 0 0 -  PHQ-9 Score 1 0 0 0 -  Difficult doing work/chores Not difficult at all Not difficult at all Not difficult at all - -    The patient does not have a history of falls. I did not complete a risk assessment for falls. A plan of care for falls was not documented.   Past Medical History:  Past Medical History:  Diagnosis Date   Allergy    Bilateral leg edema    Lymphedema of both lower extremities    Obesity (BMI 35.0-39.9 without comorbidity) 03/02/2017   Sleep apnea    CPAP    Surgical History:  Past Surgical History:  Procedure Laterality Date   ABDOMINAL HYSTERECTOMY  2006   Partial   COLONOSCOPY WITH PROPOFOL N/A 04/26/2016   Procedure: COLONOSCOPY WITH PROPOFOL;  Surgeon: Robert Bellow, MD;  Location: [facility]  ENDOSCOPY;  Service: Endoscopy;  Laterality: N/A;    Medications:  Current Outpatient Medications on File Prior to Visit  Medication Sig   MULTIPLE VITAMINS PO Take by mouth.   No current facility-administered medications on file prior to visit.    Allergies:  No Known Allergies  Social History:  Social History   Socioeconomic History   Marital status: Married    Spouse name: Research scientist (life sciences)   Number of children: 2   Years of education: 73   Highest education level: Master's degree (e.g., MA, MS, MEng, MEd, MSW, MBA)  Occupational History   Not on file  Tobacco Use   Smoking status: Never   Smokeless tobacco: Never  Vaping Use   Vaping Use: Never used  Substance and Sexual Activity   Alcohol use: Yes    Alcohol/week: 0.0 standard drinks    Comment: occasional   Drug use: No   Sexual activity: Yes    Partners: Male  Other Topics Concern   Not on file  Social History Narrative   Not on file   Social Determinants of Health   Financial Resource Strain: Low Risk    Difficulty of Paying Living Expenses: Not  hard at all  Food Insecurity: No Food Insecurity   Worried About Charity fundraiser in the Last Year: Never true   Ran Out of Food in the Last Year: Never true  Transportation Needs: No Transportation Needs   Lack of Transportation (Medical): No   Lack of Transportation (Non-Medical): No  Physical Activity: Insufficiently Active   Days of Exercise per Week: 1 day   Minutes of Exercise per Session: 20 min  Stress: No Stress Concern Present   Feeling of Stress : Only a little  Social Connections: Engineer, building services of Communication with Friends and Family: More than three times a week   Frequency of Social Gatherings with Friends and Family: More than three times a week   Attends Religious Services: 1 to 4 times per year   Active Member of [employer] or Organizations: No   Attends Music therapist: 1 to 4 times per year   Marital Status: Married   Human resources officer Violence: Not At Risk   Fear of Current or Ex-Partner: No   Emotionally Abused: No   Physically Abused: No   Sexually Abused: No   Social History   Tobacco Use  Smoking Status Never  Smokeless Tobacco Never   Social History   Substance and Sexual Activity  Alcohol Use Yes   Alcohol/week: 0.0 standard drinks   Comment: occasional    Family History:  Family History  Problem Relation Age of Onset   Hypertension Mother    Parkinson's disease Mother    Hypertension Sister    Hypertension Paternal Aunt        pat great aunt   Cancer Father        colon or lung ?   Breast cancer Paternal Aunt    Hernia Brother    ADD / ADHD Son    Cancer Maternal Grandfather        lung    Past medical history, surgical history, medications, allergies, family history and social history reviewed with patient today and changes made to appropriate areas of the chart.      Objective:    BP 132/84   Pulse 80   Temp 98.1 F (36.7 C)   Ht '5\' 5"'$  (1.651 m)   Wt 245 lb 1.6 oz (111.2 kg)   SpO2 97%   BMI 40.79 kg/m   Wt Readings from Last 3 Encounters:  06/07/21 245 lb 1.6 oz (111.2 kg)  08/06/19 218 lb (98.9 kg)  07/30/18 218 lb (98.9 kg)    Physical Exam Constitutional:      General: She is not in acute distress.    Appearance: She is obese.  HENT:     Head: Normocephalic.     Right Ear: External ear normal. There is impacted cerumen.     Left Ear: Tympanic membrane and external ear normal.     Nose: Nose normal.     Mouth/Throat:     Mouth: Mucous membranes are moist.     Pharynx: Oropharynx is clear.  Eyes:     Pupils: Pupils are equal, round, and reactive to light.  Cardiovascular:     Rate and Rhythm: Normal rate and regular rhythm.     Heart sounds: Normal heart sounds. No murmur heard. Pulmonary:     Effort: Pulmonary effort is normal. No respiratory distress.     Breath sounds: Normal breath sounds.  Abdominal:     General: Bowel sounds are  normal.     Palpations:  Abdomen is soft. There is no mass.     Tenderness: There is no abdominal tenderness.  Musculoskeletal:        General: Normal range of motion.     Cervical back: Normal range of motion. No tenderness.  Lymphadenopathy:     Cervical: No cervical adenopathy.  Skin:    General: Skin is warm and dry.  Neurological:     Mental Status: She is alert and oriented to person, place, and time. Mental status is at baseline.  Psychiatric:        Mood and Affect: Mood normal.        Behavior: Behavior normal.    Results for orders placed or performed in visit on 08/06/19  Lipid Profile  Result Value Ref Range   Cholesterol, Total 238 (H) 100 - 199 mg/dL   Triglycerides 95 0 - 149 mg/dL   HDL 71 >39 mg/dL   VLDL Cholesterol Cal 16 5 - 40 mg/dL   LDL Chol Calc (NIH) 151 (H) 0 - 99 mg/dL   Chol/HDL Ratio 3.4 0.0 - 4.4 ratio  Comprehensive Metabolic Panel (CMET)  Result Value Ref Range   Glucose 88 65 - 99 mg/dL   BUN 12 6 - 24 mg/dL   Creatinine, Ser 0.78 0.57 - 1.00 mg/dL   GFR calc non Af Amer 86 >59 mL/min/1.73   GFR calc Af Amer 99 >59 mL/min/1.73   BUN/Creatinine Ratio 15 9 - 23   Sodium 141 134 - 144 mmol/L   Potassium 4.2 3.5 - 5.2 mmol/L   Chloride 102 96 - 106 mmol/L   CO2 28 20 - 29 mmol/L   Calcium 9.9 8.7 - 10.2 mg/dL   Total Protein 7.5 6.0 - 8.5 g/dL   Albumin 4.5 3.8 - 4.9 g/dL   Globulin, Total 3.0 1.5 - 4.5 g/dL   Albumin/Globulin Ratio 1.5 1.2 - 2.2   Bilirubin Total 0.2 0.0 - 1.2 mg/dL   Alkaline Phosphatase 102 39 - 117 IU/L   AST 22 0 - 40 IU/L   ALT 22 0 - 32 IU/L      Assessment & Plan:   Problem List Items Addressed This Visit       Other   Morbid obesity with BMI of 40.0-44.9, adult ([place])    Currently without comorbidity. Check labs. Interested in weight management program but wants to try consistent lifestyle modifications first.       Other Visit Diagnoses     Encounter for screening mammogram for malignant neoplasm of  breast    -  Primary   Relevant Orders   MM DIGITAL SCREENING BILATERAL   Screening for colon cancer       Relevant Orders   Ambulatory referral to Gastroenterology   Estrogen deficiency       Relevant Orders   DG Bone Density   Need for Tdap vaccination       Relevant Orders   Tdap vaccine greater than or equal to 7yo IM (Completed)        Follow up plan: Return in about 1 year (around 06/07/2022) for cpe.   LABORATORY TESTING:  - Pap smear: not applicable, s/p hysterectomy  IMMUNIZATIONS:   - Tdap: Tetanus vaccination status reviewed: Td vaccination indicated and given today. - Influenza: Refused - Pneumovax: Not applicable - Prevnar: Not applicable - HPV: Not applicable - Shingrix vaccine:  has had one dose, due for 2nd. - COVID vaccine: has received 2 doses of mRNA vaccine  SCREENING: - Mammogram:  Ordered today  - Colonoscopy:  due , referred today.  - Bone Density: Ordered today  - Lung Cancer Screening: Not applicable   Hep C Screening: UTD STD testing and prevention (HIV/chl/gon/syphilis): UTD Sexual History: monogamous  Menstrual History/LMP/Abnormal Bleeding:  Incontinence Symptoms:   Osteoporosis: Discussed high calcium and vitamin D supplementation, weight bearing exercises  Advanced Care Planning: A voluntary discussion about advance care planning including the explanation and discussion of advance directives.  Discussed health care proxy and Living will, and the patient was able to identify a health care proxy as husband, Zhane Cerny.  Patient does not have a living will at present time. If patient does have living will, I have requested they bring this to the clinic to be scanned in to their chart.  PATIENT COUNSELING:   Advised to take 1 mg of folate supplement per day if capable of pregnancy.   Sexuality: Discussed sexually transmitted diseases, partner selection, use of condoms, avoidance of unintended pregnancy  and contraceptive  alternatives.   Advised to avoid cigarette smoking.  I discussed with the patient that most people either abstain from alcohol or drink within safe limits (<=14/week and <=4 drinks/occasion for males, <=7/weeks and <= 3 drinks/occasion for females) and that the risk for alcohol disorders and other health effects rises proportionally with the number of drinks per week and how often a drinker exceeds daily limits.  Discussed cessation/primary prevention of drug use and availability of treatment for abuse.   Diet: Encouraged to adjust caloric intake to maintain  or achieve ideal body weight, to reduce intake of dietary saturated fat and total fat, to limit sodium intake by avoiding high sodium foods and not adding table salt, and to maintain adequate dietary potassium and calcium preferably from fresh fruits, vegetables, and low-fat dairy products.    stressed the importance of regular exercise  Injury prevention: Discussed safety belts, safety helmets, smoke detector, smoking near bedding or upholstery.   Dental health: Discussed importance of regular tooth brushing, flossing, and dental visits.    NEXT PREVENTATIVE PHYSICAL DUE IN 1 YEAR. Return in about 1 year (around 06/07/2022) for cpe.

## 2021-06-07 NOTE — Patient Instructions (Signed)
It was great to see you!  Our plans for today:  - You received your tetanus vaccine today.  - Get your 2nd dose of shingles vaccine at your pharmacy. - Call to schedule your mammogram and bone density study. - We referred you to update your colonoscopy. - Let us know if you would like help in trying to lose weight.  We are checking some labs today, we will release these results to your MyChart.  Take care and seek immediate care sooner if you develop any concerns.   Dr. Ky Barban  Things to do to keep yourself healthy  - Exercise at least 30-45 minutes a day, 3-4 days a week.  - Eat a low-fat diet with lots of fruits and vegetables, up to 7-9 servings per day.  - Seatbelts can save your life. Wear them always.  - Smoke detectors on every level of your home, check batteries every year.  - Eye Doctor - have an eye exam every 1-2 years  - Safe sex - if you may be exposed to STDs, use a condom.  - Alcohol -  If you drink, do it moderately, less than 2 drinks per day.  - [place]. Choose someone to speak for you if you are not able. https://www.prepareforyourcare.org is a great website to help you navigate this. - Depression is common in our stressful world.If you're feeling down or losing interest in things you normally enjoy, please come in for a visit.  - Violence - If anyone is threatening or hurting you, please call immediately.

## 2021-06-08 ENCOUNTER — Telehealth: Payer: Self-pay

## 2021-06-08 NOTE — Telephone Encounter (Signed)
CALLED PATIENT NO ANSWER AND VOICEMAIL WAS FULL

## 2021-06-09 ENCOUNTER — Telehealth: Payer: Self-pay

## 2021-06-09 NOTE — Telephone Encounter (Signed)
Called patient no answer no way to leave voicemail

## 2022-02-07 ENCOUNTER — Telehealth: Payer: Self-pay | Admitting: Family Medicine

## 2022-02-07 NOTE — Telephone Encounter (Signed)
Pt called requesting a new CPAP machine, she says her current one is 59 years old and getting replacement parts is difficult. Wants a smaller device, she says her current one is too big.  ? ?Right now she needs a replacement mask but cannot get one without a prescription.  ? ?Best contact: [phone] ? ?Wants a call back from the clinic  ?

## 2022-02-08 ENCOUNTER — Other Ambulatory Visit: Payer: Self-pay

## 2022-02-08 ENCOUNTER — Telehealth: Payer: Self-pay

## 2022-02-08 DIAGNOSIS — Z1231 Encounter for screening mammogram for malignant neoplasm of breast: Secondary | ICD-10-CM

## 2022-02-08 DIAGNOSIS — E2839 Other primary ovarian failure: Secondary | ICD-10-CM

## 2022-02-08 NOTE — Telephone Encounter (Signed)
Copied from [place]. Topic: Referral - Request for Referral >> Feb 07, 2022  4:36 PM Erick Blinks wrote: Has patient seen PCP for this complaint? Yes.   *If NO, is insurance requiring patient see PCP for this issue before PCP can refer them? Referral for which specialty: Mammography  Preferred provider/office: highest recommended in [place], [place]  Reason for referral: Due for screening

## 2022-02-08 NOTE — Telephone Encounter (Signed)
Pt notified will discuss at appt next week ?

## 2022-02-08 NOTE — Telephone Encounter (Signed)
Ordered pt notified ?

## 2022-02-15 ENCOUNTER — Encounter: Payer: Self-pay | Admitting: Family Medicine

## 2022-02-15 ENCOUNTER — Ambulatory Visit: Payer: Managed Care, Other (non HMO) | Admitting: Family Medicine

## 2022-02-15 VITALS — BP 126/82 | HR 95 | Temp 97.6°F | Resp 18 | Ht 64.5 in | Wt 247.0 lb

## 2022-02-15 DIAGNOSIS — Z9989 Dependence on other enabling machines and devices: Secondary | ICD-10-CM

## 2022-02-15 DIAGNOSIS — Z6841 Body Mass Index (BMI) 40.0 and over, adult: Secondary | ICD-10-CM

## 2022-02-15 DIAGNOSIS — R600 Localized edema: Secondary | ICD-10-CM | POA: Diagnosis not present

## 2022-02-15 DIAGNOSIS — R7303 Prediabetes: Secondary | ICD-10-CM

## 2022-02-15 DIAGNOSIS — R635 Abnormal weight gain: Secondary | ICD-10-CM | POA: Diagnosis not present

## 2022-02-15 DIAGNOSIS — G4733 Obstructive sleep apnea (adult) (pediatric): Secondary | ICD-10-CM | POA: Diagnosis not present

## 2022-02-15 DIAGNOSIS — E785 Hyperlipidemia, unspecified: Secondary | ICD-10-CM

## 2022-02-15 DIAGNOSIS — Z1211 Encounter for screening for malignant neoplasm of colon: Secondary | ICD-10-CM

## 2022-02-15 DIAGNOSIS — E8881 Metabolic syndrome: Secondary | ICD-10-CM

## 2022-02-15 NOTE — Progress Notes (Signed)
? ?Name: Sierra Buck   MRN: [id number]    DOB: Oct 14, 1960   Date:02/15/2022 ? ?     Progress Note ? ?Chief Complaint  ?Patient presents with  ? Weight Loss  ? Obstructive Sleep Apnea  ?  Needs new supplies  ? ? ? ?Subjective:  ? ?Sierra Buck is a 61 y.o. female, presents to clinic requesting CPAP and asking about weight loss meds ? ?She reports having OSA and being on CPAP starting well over 15years ago.  She needs new supplies and was told she has to get from PCP. Reviewed chart history extensively there is no past Dx or documentation of dx or sleep study results or setting or prior orders.   ?CPAP 15 years ago need new supplies done a long time ago with Dr. Rutherford Nail  ? ? ?Obesity - BMI 41.74 ?Pt looking to loose some weight  ?Tried doing pel[place] bike, shes tried exercising with videos ?Watching what she eats, organic and avoids red meat ?Tracked calories on myfitnesspal - 1200-1500 was goal but sometimes 1800/higher -craves sweet ?She's tried weight watchers and worked with dietician before ? ?Wt Readings from Last 10 Encounters:  ?02/15/22 247 lb (112 kg)  ?06/07/21 245 lb 1.6 oz (111.2 kg)  ?08/06/19 218 lb (98.9 kg)  ?07/30/18 218 lb (98.9 kg)  ?07/13/18 223 lb 1.6 oz (101.2 kg)  ?03/02/17 219 lb 9.6 oz (99.6 kg)  ?04/26/16 210 lb (95.3 kg)  ?03/10/16 217 lb (98.4 kg)  ?08/27/15 201 lb (91.2 kg)  ?08/19/15 199 lb 4.8 oz (90.4 kg)  ? ?BMI Readings from Last 5 Encounters:  ?02/15/22 41.74 kg/m?  ?06/07/21 40.79 kg/m?  ?08/06/19 36.28 kg/m?  ?07/30/18 36.84 kg/m?  ?07/13/18 38.30 kg/m?  ? ?Family - a lot of family members with obesity ? ? ? ? ? ? ? ?Current Outpatient Medications:  ?  MULTIPLE VITAMINS PO, Take by mouth., Disp: , Rfl:  ? ?Patient Active Problem List  ? Diagnosis Date Noted  ? Preventative health care 03/02/2017  ? Morbid obesity with BMI of 40.0-44.9, adult ([place]) 03/02/2017  ? Seborrheic keratoses 03/02/2017  ? Encounter for screening colonoscopy 03/10/2016  ? ? ?Past Surgical History:   ?Procedure Laterality Date  ? ABDOMINAL HYSTERECTOMY  2006  ? Partial  ? COLONOSCOPY WITH PROPOFOL N/A 04/26/2016  ? Procedure: COLONOSCOPY WITH PROPOFOL;  Surgeon: Robert Bellow, MD;  Location: [facility] ENDOSCOPY;  Service: Endoscopy;  Laterality: N/A;  ? ? ?Family History  ?Problem Relation Age of Onset  ? Hypertension Mother   ? Parkinson's disease Mother   ? Hypertension Sister   ? Hypertension Paternal Aunt   ?     pat great aunt  ? Cancer Father   ?     colon or lung ?  ? Breast cancer Paternal Aunt   ? Hernia Brother   ? ADD / ADHD Son   ? Cancer Maternal Grandfather   ?     lung  ? ? ?Social History  ? ?Tobacco Use  ? Smoking status: Never  ? Smokeless tobacco: Never  ?Vaping Use  ? Vaping Use: Never used  ?Substance Use Topics  ? Alcohol use: Yes  ?  Alcohol/week: 0.0 standard drinks  ?  Comment: occasional  ? Drug use: No  ?  ? ?No Known Allergies ? ?Health Maintenance  ?Topic Date Due  ? MAMMOGRAM  02/07/2018  ? COLONOSCOPY (Pts 45-52yr Insurance coverage will need to be confirmed)  04/27/2019  ? COVID-19 Vaccine (1)  03/03/2022 (Originally 03/16/1962)  ? Zoster Vaccines- Shingrix (1 of 2) 05/18/2022 (Originally 09/15/1982)  ? INFLUENZA VACCINE  05/10/2022  ? TETANUS/TDAP  06/08/2031  ? Hepatitis C Screening  Completed  ? HIV Screening  Completed  ? HPV VACCINES  Aged Out  ? PAP SMEAR-Modifier  Discontinued  ? ? ?Chart Review Today: ?I personally reviewed active problem list, medication list, allergies, family history, social history, health maintenance, notes from last encounter, lab results, imaging with the patient/caregiver today. ? ? ?Review of Systems  ?Constitutional: Negative.   ?HENT: Negative.    ?Eyes: Negative.   ?Respiratory: Negative.    ?Cardiovascular: Negative.   ?Gastrointestinal: Negative.   ?Endocrine: Negative.   ?Genitourinary: Negative.   ?Musculoskeletal: Negative.   ?Skin: Negative.   ?Allergic/Immunologic: Negative.   ?Neurological: Negative.   ?Hematological: Negative.    ?Psychiatric/Behavioral: Negative.    ?All other systems reviewed and are negative.  ? ?Objective:  ? ?Vitals:  ? 02/15/22 1406  ?BP: 126/82  ?Pulse: 95  ?Resp: 18  ?Temp: 97.6 ?F (36.4 ?C)  ?TempSrc: Oral  ?SpO2: 98%  ?Weight: 247 lb (112 kg)  ?Height: 5' 4.5" (1.638 m)  ? ? ?Body mass index is 41.74 kg/m?. ? ?Physical Exam ?Vitals and nursing note reviewed.  ?Constitutional:   ?   General: She is not in acute distress. ?   Appearance: Normal appearance. She is well-developed and well-groomed. She is obese. She is not ill-appearing, toxic-appearing or diaphoretic.  ?HENT:  ?   Head: Normocephalic and atraumatic.  ?   Right Ear: External ear normal.  ?   Left Ear: External ear normal.  ?Eyes:  ?   General: Lids are normal. No scleral icterus.    ?   Right eye: No discharge.     ?   Left eye: No discharge.  ?   Conjunctiva/sclera: Conjunctivae normal.  ?Neck:  ?   Trachea: Phonation normal. No tracheal deviation.  ?Cardiovascular:  ?   Rate and Rhythm: Normal rate and regular rhythm.  ?   Pulses: Normal pulses.     ?     Radial pulses are 2+ on the right side and 2+ on the left side.  ?     Posterior tibial pulses are 2+ on the right side and 2+ on the left side.  ?   Heart sounds: Normal heart sounds. No murmur heard. ?  No friction rub. No gallop.  ?Pulmonary:  ?   Effort: Pulmonary effort is normal. No respiratory distress.  ?   Breath sounds: Normal breath sounds. No stridor. No wheezing, rhonchi or rales.  ?Chest:  ?   Chest wall: No tenderness.  ?Abdominal:  ?   General: Bowel sounds are normal. There is no distension.  ?   Palpations: Abdomen is soft.  ?Musculoskeletal:  ?   Right lower leg: No edema.  ?   Left lower leg: No edema.  ?Skin: ?   General: Skin is warm and dry.  ?   Coloration: Skin is not jaundiced or pale.  ?   Findings: No rash.  ?Neurological:  ?   Mental Status: She is alert.  ?   Motor: No abnormal muscle tone.  ?   Gait: Gait normal.  ?Psychiatric:     ?   Mood and Affect: Mood normal.      ?   Speech: Speech normal.     ?   Behavior: Behavior normal. Behavior is cooperative.  ?  ? ? ? ? ?  Assessment & Plan:  ? ? ? 1. OSA on CPAP ?No records available to help me put in with CPAP orders ?May need to retest or at least do titration study for settings - refer out to sleep med specialists ?Thoroughly reviewed chart and cannot find any records ?- Ambulatory referral to Pulmonology ? ?2. Morbid obesity with BMI of 40.0-44.9, adult ([place]) ?We discussed some of the available meds - explained weight loss must be gradual and meds should be accompanied by by diet and lifestyle efforts ?Counseled on diet and lifestyle principles and things which could help with healthy weight loss ?Gave her options such as referral to weight management ?I explained that many of the medications may only give temporary weight loss ?Encouraged her to work on a doable calorie deficit ?We will do screening labs to evaluate for metabolic dysfunction and associated diseases ?Encouraged her to check with her insurance about what medications are covered ?- CBC with Differential/Platelet ?- Lipid panel ?- Hemoglobin A1c ?- Comprehensive metabolic panel ?- TSH + free T4 ?- Insulin, random ?- Ambulatory referral to Pulmonology ?- Semaglutide-Weight Management 0.25 MG/0.5ML SOAJ; Inject 0.25 mg into the skin once a week for 28 days.  Dispense: 2 mL; Refill: 0 ? ?3. Unintended weight gain ?Recent weight gain ?- Semaglutide-Weight Management 0.25 MG/0.5ML SOAJ; Inject 0.25 mg into the skin once a week for 28 days.  Dispense: 2 mL; Refill: 0 ? ?4. Bilateral lower extremity edema ?States this is chronic ? ?5. Metabolic syndrome ?Abdominal obesity, increased BMI, history of prediabetes and dyslipidemia ?- Semaglutide-Weight Management 0.25 MG/0.5ML SOAJ; Inject 0.25 mg into the skin once a week for 28 days.  Dispense: 2 mL; Refill: 0 ? ?6. Hyperlipidemia, unspecified hyperlipidemia type ?Lipids and CMP checked today ? ?7. Prediabetes ?Recheck  A1c ? ? ?F/up pending insurance coverage for meds -if starting meds - 1 month f/up needed ? ?Delsa Grana, PA-C ?02/15/22 2:11 PM ? ? ?

## 2022-02-15 NOTE — Patient Instructions (Addendum)
Call insurance and get list of meds they cover for weight loss  ? ? ?

## 2022-02-16 ENCOUNTER — Encounter: Payer: Self-pay | Admitting: Family Medicine

## 2022-02-17 DIAGNOSIS — R7303 Prediabetes: Secondary | ICD-10-CM | POA: Insufficient documentation

## 2022-02-17 DIAGNOSIS — R600 Localized edema: Secondary | ICD-10-CM | POA: Insufficient documentation

## 2022-02-17 DIAGNOSIS — R635 Abnormal weight gain: Secondary | ICD-10-CM | POA: Insufficient documentation

## 2022-02-17 DIAGNOSIS — G4733 Obstructive sleep apnea (adult) (pediatric): Secondary | ICD-10-CM | POA: Insufficient documentation

## 2022-02-17 DIAGNOSIS — E8881 Metabolic syndrome: Secondary | ICD-10-CM | POA: Insufficient documentation

## 2022-02-17 DIAGNOSIS — E785 Hyperlipidemia, unspecified: Secondary | ICD-10-CM | POA: Insufficient documentation

## 2022-02-17 LAB — COMPREHENSIVE METABOLIC PANEL
ALT: 22 IU/L (ref 0–32)
AST: 18 IU/L (ref 0–40)
Albumin/Globulin Ratio: 1.4 (ref 1.2–2.2)
Albumin: 4.3 g/dL (ref 3.8–4.9)
Alkaline Phosphatase: 109 IU/L (ref 44–121)
BUN/Creatinine Ratio: 18 (ref 9–23)
BUN: 13 mg/dL (ref 6–24)
Bilirubin Total: 0.3 mg/dL (ref 0.0–1.2)
CO2: 24 mmol/L (ref 20–29)
Calcium: 9.5 mg/dL (ref 8.7–10.2)
Chloride: 101 mmol/L (ref 96–106)
Creatinine, Ser: 0.71 mg/dL (ref 0.57–1.00)
Globulin, Total: 3.1 g/dL (ref 1.5–4.5)
Glucose: 83 mg/dL (ref 70–99)
Potassium: 3.9 mmol/L (ref 3.5–5.2)
Sodium: 140 mmol/L (ref 134–144)
Total Protein: 7.4 g/dL (ref 6.0–8.5)
eGFR: 98 mL/min/{1.73_m2} (ref >59)

## 2022-02-17 LAB — CBC WITH DIFFERENTIAL/PLATELET
Basophils Absolute: 0 x10E3/uL (ref 0.0–0.2)
Basos: 1 %
EOS (ABSOLUTE): 0.3 x10E3/uL (ref 0.0–0.4)
Eos: 6 %
Hematocrit: 41.3 % (ref 34.0–46.6)
Hemoglobin: 13.5 g/dL (ref 11.1–15.9)
Immature Grans (Abs): 0 x10E3/uL (ref 0.0–0.1)
Immature Granulocytes: 0 %
Lymphocytes Absolute: 2.5 x10E3/uL (ref 0.7–3.1)
Lymphs: 49 %
MCH: 28.2 pg (ref 26.6–33.0)
MCHC: 32.7 g/dL (ref 31.5–35.7)
MCV: 86 fL (ref 79–97)
Monocytes Absolute: 0.4 x10E3/uL (ref 0.1–0.9)
Monocytes: 8 %
Neutrophils Absolute: 1.8 x10E3/uL (ref 1.4–7.0)
Neutrophils: 36 %
Platelets: 289 x10E3/uL (ref 150–450)
RBC: 4.78 x10E6/uL (ref 3.77–5.28)
RDW: 13.2 % (ref 11.7–15.4)
WBC: 5.1 x10E3/uL (ref 3.4–10.8)

## 2022-02-17 LAB — LIPID PANEL
Chol/HDL Ratio: 3.5 ratio (ref 0.0–4.4)
Cholesterol, Total: 261 mg/dL — ABNORMAL HIGH (ref 100–199)
HDL: 75 mg/dL (ref >39)
LDL Chol Calc (NIH): 159 mg/dL — ABNORMAL HIGH (ref 0–99)
Triglycerides: 153 mg/dL — ABNORMAL HIGH (ref 0–149)
VLDL Cholesterol Cal: 27 mg/dL (ref 5–40)

## 2022-02-17 LAB — HEMOGLOBIN A1C
Est. average glucose Bld gHb Est-mCnc: 126 mg/dL
Hgb A1c MFr Bld: 6 % — ABNORMAL HIGH (ref 4.8–5.6)

## 2022-02-17 LAB — INSULIN, RANDOM: INSULIN: 12.8 u[IU]/mL (ref 2.6–24.9)

## 2022-02-17 LAB — TSH+FREE T4
Free T4: 1.07 ng/dL (ref 0.82–1.77)
TSH: 2.43 u[IU]/mL (ref 0.450–4.500)

## 2022-02-17 MED ORDER — SEMAGLUTIDE-WEIGHT MANAGEMENT 0.25 MG/0.5ML ~~LOC~~ SOAJ: 0.2500 mg | SUBCUTANEOUS | 0 refills | Status: AC

## 2022-03-01 ENCOUNTER — Telehealth: Payer: Self-pay

## 2022-03-01 NOTE — Telephone Encounter (Signed)
Unable to reach by phone, sent mychart message to confirm 03/08/22 appointment-Toni

## 2022-03-08 ENCOUNTER — Ambulatory Visit: Payer: Self-pay | Admitting: Internal Medicine

## 2022-03-18 ENCOUNTER — Ambulatory Visit: Payer: Self-pay

## 2022-03-18 ENCOUNTER — Encounter: Payer: Self-pay | Admitting: Family Medicine

## 2022-03-18 ENCOUNTER — Ambulatory Visit: Payer: Managed Care, Other (non HMO) | Admitting: Family Medicine

## 2022-03-18 VITALS — BP 134/86 | HR 94 | Resp 16 | Ht 64.0 in | Wt 241.0 lb

## 2022-03-18 DIAGNOSIS — Z6841 Body Mass Index (BMI) 40.0 and over, adult: Secondary | ICD-10-CM

## 2022-03-18 DIAGNOSIS — R635 Abnormal weight gain: Secondary | ICD-10-CM

## 2022-03-18 DIAGNOSIS — Z7689 Persons encountering health services in other specified circumstances: Secondary | ICD-10-CM

## 2022-03-18 DIAGNOSIS — E8881 Metabolic syndrome: Secondary | ICD-10-CM

## 2022-03-18 MED ORDER — SEMAGLUTIDE-WEIGHT MANAGEMENT 1 MG/0.5ML ~~LOC~~ SOAJ: 1.0000 mg | SUBCUTANEOUS | 0 refills | Status: AC

## 2022-03-18 MED ORDER — SEMAGLUTIDE-WEIGHT MANAGEMENT 0.5 MG/0.5ML ~~LOC~~ SOAJ: 0.5000 mg | SUBCUTANEOUS | 0 refills | Status: DC

## 2022-03-18 NOTE — Telephone Encounter (Signed)
  Chief Complaint: Unable to get medication refilled until Monday. Next dose is due today. Symptoms:  Frequency:  Pertinent Negatives: Patient denies  Disposition: '[]'$ ED /'[]'$ Urgent Care (no appt availability in office) / '[]'$ Appointment(In office/virtual)/ '[]'$  [place] [facility]/ '[]'$ Home Care/ '[]'$ Refused Recommended Disposition /'[]'$ [place] [facility]/ '[x]'$  Follow-up with PCP Additional Notes: Agent summary: "Pt called reporting that the pharmacy will not have her wegovy available until Monday. She wants to know if this will hurt her process? Wants a call back to discuss alternative options. She may want it sent elsewhere. "       Summary: Wegovy not available until monday, questions for nurse   Pt called reporting that the pharmacy will not have her wegovy available until Monday. She wants to know if this will hurt her process? Wants a call back to discuss alternative options. She may want it sent elsewhere.   Best contact: [phone]      Answer Assessment - Initial Assessment Questions 1. DRUG NAME: "What medicine do you need to have refilled?"     Wegovy 2. REFILLS REMAINING: "How many refills are remaining?" (Note: The label on the medicine or pill bottle will show how many refills are remaining. If there are no refills remaining, then a renewal may be needed.)      3. EXPIRATION DATE: "What is the expiration date?" (Note: The label states when the prescription will expire, and thus can no longer be refilled.)      4. PRESCRIBING HCP: "Who prescribed it?" Reason: If prescribed by specialist, call should be referred to that group.     Leisa Tapia 5. SYMPTOMS: "Do you have any symptoms?"      6. PREGNANCY: "Is there any chance that you are pregnant?" "When was your last menstrual period?"  Protocols used: Medication Refill and Renewal Call-A-AH

## 2022-03-18 NOTE — Progress Notes (Signed)
Name: Sierra Buck   MRN: [id number]    DOB: January 22, 1961   Date:03/18/2022       Progress Note  Chief Complaint  Patient presents with   Follow-up     Subjective:   Sierra Buck is a 60 y.o. female, presents to clinic for   Visit #:  2 Starting Weight:  247  Current weight:  241 Previous weight:  247 Wt Readings from Last 5 Encounters:  03/18/22 241 lb (109.3 kg)  02/15/22 247 lb (112 kg)  06/07/21 245 lb 1.6 oz (111.2 kg)  08/06/19 218 lb (98.9 kg)  07/30/18 218 lb (98.9 kg)   Change in weight:  -6 Goal weight:  160 Dietary goals: increasing activity Exercise goals:  trying to be more active Medication:  wegovy - started initial dose w/o any SE or concerns Follow-up and referrals:     Noom - helping with relation with food  Did get contacted by sleep medicine  Sick with URI sx   Current Outpatient Medications:    MULTIPLE VITAMINS PO, Take by mouth., Disp: , Rfl:   Patient Active Problem List   Diagnosis Date Noted   Unintended weight gain 02/17/2022   OSA on CPAP 02/17/2022   Bilateral lower extremity edema 02/17/2022   Metabolic syndrome 02/17/2022   Hyperlipidemia 02/17/2022   Prediabetes 02/17/2022   Preventative health care 03/02/2017   Morbid obesity with BMI of 40.0-44.9, adult ([facility]) 03/02/2017   Seborrheic keratoses 03/02/2017   Encounter for screening colonoscopy 03/10/2016    Past Surgical History:  Procedure Laterality Date   ABDOMINAL HYSTERECTOMY  2006   Partial   COLONOSCOPY WITH PROPOFOL N/A 04/26/2016   Procedure: COLONOSCOPY WITH PROPOFOL;  Surgeon: Earline Mayotte, MD;  Location: [facility] ENDOSCOPY;  Service: Endoscopy;  Laterality: N/A;    Family History  Problem Relation Age of Onset   Hypertension Mother    Parkinson's disease Mother    Hypertension Sister    Hypertension Paternal Aunt        pat great aunt   Cancer Father        colon or lung ?   Breast cancer Paternal Aunt    Hernia Brother    ADD / ADHD Son     Cancer Maternal Grandfather        lung    Social History   Tobacco Use   Smoking status: Never   Smokeless tobacco: Never  Vaping Use   Vaping Use: Never used  Substance Use Topics   Alcohol use: Yes    Alcohol/week: 0.0 standard drinks of alcohol    Comment: occasional   Drug use: No     No Known Allergies  Health Maintenance  Topic Date Due   COVID-19 Vaccine (1) Never done   MAMMOGRAM  02/07/2018   COLONOSCOPY (Pts 45-54yrs Insurance coverage will need to be confirmed)  04/27/2019   Zoster Vaccines- Shingrix (1 of 2) 05/18/2022 (Originally 09/15/1982)   INFLUENZA VACCINE  05/10/2022   TETANUS/TDAP  06/08/2031   Hepatitis C Screening  Completed   HIV Screening  Completed   HPV VACCINES  Aged Out   PAP SMEAR-Modifier  Discontinued    Chart Review Today: I personally reviewed active problem list, medication list, allergies, family history, social history, health maintenance, notes from last encounter, lab results, imaging with the patient/caregiver today.   Review of Systems  Constitutional: Negative.   HENT: Negative.    Eyes: Negative.   Respiratory: Negative.    Cardiovascular:  Negative.   Gastrointestinal: Negative.   Endocrine: Negative.   Genitourinary: Negative.   Musculoskeletal: Negative.   Skin: Negative.   Allergic/Immunologic: Negative.   Neurological: Negative.   Hematological: Negative.   Psychiatric/Behavioral: Negative.    All other systems reviewed and are negative.    Objective:   Vitals:   03/18/22 0853  BP: 134/86  Pulse: 94  Resp: 16  SpO2: 98%  Weight: 241 lb (109.3 kg)  Height: 5\' 4"  (1.626 m)    Body mass index is 41.37 kg/m.  Physical Exam Vitals and nursing note reviewed.  Constitutional:      General: She is not in acute distress.    Appearance: Normal appearance. She is well-developed. She is obese. She is not ill-appearing, toxic-appearing or diaphoretic.  HENT:     Head: Normocephalic and atraumatic.      Right Ear: External ear normal.     Left Ear: External ear normal.     Nose: Nose normal.  Eyes:     General:        Right eye: No discharge.        Left eye: No discharge.     Conjunctiva/sclera: Conjunctivae normal.  Neck:     Trachea: No tracheal deviation.  Cardiovascular:     Rate and Rhythm: Normal rate and regular rhythm.  Pulmonary:     Effort: Pulmonary effort is normal. No respiratory distress.     Breath sounds: No stridor.  Musculoskeletal:        General: Normal range of motion.  Skin:    General: Skin is warm and dry.     Findings: No rash.  Neurological:     Mental Status: She is alert.     Motor: No abnormal muscle tone.     Coordination: Coordination normal.  Psychiatric:        Behavior: Behavior normal.         Assessment & Plan:   1. Morbid obesity with BMI of 40.0-44.9, adult ([facility]) Increase dose of med, no SE Reviewed continued diet/lifestyle efforts Working on Noom - Semaglutide-Weight Management 0.5 MG/0.5ML SOAJ; Inject 0.5 mg into the skin once a week for 28 days.  Dispense: 2 mL; Refill: 0 - Semaglutide-Weight Management 1 MG/0.5ML SOAJ; Inject 1 mg into the skin once a week for 28 days.  Dispense: 2 mL; Refill: 0  2. Encounter for weight management Down 6 lbs - even though she was sick for 2/4 weeks since last OV F/up in one month Continue diet efforts Increase physical activity  Dose increase, reviewed SE - Semaglutide-Weight Management 0.5 MG/0.5ML SOAJ; Inject 0.5 mg into the skin once a week for 28 days.  Dispense: 2 mL; Refill: 0 - Semaglutide-Weight Management 1 MG/0.5ML SOAJ; Inject 1 mg into the skin once a week for 28 days.  Dispense: 2 mL; Refill: 0     Return in about 1 month (around 04/17/2022) for weight/med check.   Danelle Berry, PA-C 03/18/22 9:10 AM

## 2022-03-18 NOTE — Telephone Encounter (Signed)
Spoke with patient and she stated she will wait until Monday. She did call around to other pharmacies but they didn't have any in stock either. She stated she will call back Monday if they still haven't filled it.

## 2022-03-21 ENCOUNTER — Encounter: Payer: Self-pay | Admitting: Family Medicine

## 2022-03-27 ENCOUNTER — Other Ambulatory Visit: Payer: Self-pay | Admitting: Family Medicine

## 2022-03-27 DIAGNOSIS — R635 Abnormal weight gain: Secondary | ICD-10-CM

## 2022-03-27 DIAGNOSIS — E8881 Metabolic syndrome: Secondary | ICD-10-CM

## 2022-04-05 ENCOUNTER — Other Ambulatory Visit: Payer: Self-pay | Admitting: Family Medicine

## 2022-04-05 MED ORDER — WEGOVY 0.25 MG/0.5ML ~~LOC~~ SOAJ: 0.2500 mg | SUBCUTANEOUS | 0 refills | Status: DC

## 2022-04-06 ENCOUNTER — Other Ambulatory Visit: Payer: Self-pay | Admitting: Family Medicine

## 2022-04-06 MED ORDER — WEGOVY 0.25 MG/0.5ML ~~LOC~~ SOAJ
0.2500 mg | SUBCUTANEOUS | 0 refills | Status: DC
Start: 2022-04-06 — End: 2022-07-19

## 2022-04-18 ENCOUNTER — Encounter: Payer: Self-pay | Admitting: Family Medicine

## 2022-04-18 ENCOUNTER — Ambulatory Visit (INDEPENDENT_AMBULATORY_CARE_PROVIDER_SITE_OTHER): Payer: Managed Care, Other (non HMO) | Admitting: Family Medicine

## 2022-04-18 VITALS — BP 136/78 | HR 97 | Temp 97.5°F | Resp 16 | Ht 64.0 in | Wt 238.0 lb

## 2022-04-18 DIAGNOSIS — E785 Hyperlipidemia, unspecified: Secondary | ICD-10-CM

## 2022-04-18 DIAGNOSIS — R7303 Prediabetes: Secondary | ICD-10-CM

## 2022-04-18 DIAGNOSIS — Z6841 Body Mass Index (BMI) 40.0 and over, adult: Secondary | ICD-10-CM

## 2022-04-18 DIAGNOSIS — Z7689 Persons encountering health services in other specified circumstances: Secondary | ICD-10-CM | POA: Diagnosis not present

## 2022-04-18 DIAGNOSIS — E8881 Metabolic syndrome: Secondary | ICD-10-CM

## 2022-04-18 MED ORDER — SEMAGLUTIDE-WEIGHT MANAGEMENT 0.5 MG/0.5ML ~~LOC~~ SOAJ: 0.5000 mg | SUBCUTANEOUS | 0 refills | Status: AC

## 2022-04-18 NOTE — Progress Notes (Signed)
Name: Sierra Buck   MRN: [id number]    DOB: June 23, 1961   Date:04/18/2022       Progress Note  Chief Complaint  Patient presents with   Follow-up   Weight Check     Subjective:   Sierra Buck is a 61 y.o. female, presents to clinic for f/up on weight management/med f/up  Started wegovy last month 0.25, 0.5 and 1.0 mg doses were sent in Wt Readings from Last 5 Encounters:  04/18/22 238 lb (108 kg)  03/18/22 241 lb (109.3 kg)  02/15/22 247 lb (112 kg)  06/07/21 245 lb 1.6 oz (111.2 kg)  08/06/19 218 lb (98.9 kg)   BMI Readings from Last 5 Encounters:  04/18/22 40.85 kg/m  03/18/22 41.37 kg/m  02/15/22 41.74 kg/m  06/07/21 40.79 kg/m  08/06/19 36.28 kg/m   Out of wegovy for a month Curbs craving  No concerning SE  She was unable to get the 0.5 dose, but had no problems when on it, she's lost weight     Current Outpatient Medications:    MULTIPLE VITAMINS PO, Take by mouth., Disp: , Rfl:    Semaglutide-Weight Management (WEGOVY) 0.25 MG/0.5ML SOAJ, Inject 0.25 mg into the skin once a week., Disp: 2 mL, Rfl: 0   Semaglutide-Weight Management 0.5 MG/0.5ML SOAJ, Inject 0.5 mg into the skin once a week for 28 days., Disp: 2 mL, Rfl: 0   Semaglutide-Weight Management 1 MG/0.5ML SOAJ, Inject 1 mg into the skin once a week for 28 days. (Patient not taking: Reported on 04/18/2022), Disp: 2 mL, Rfl: 0  Patient Active Problem List   Diagnosis Date Noted   Unintended weight gain 02/17/2022   OSA on CPAP 02/17/2022   Bilateral lower extremity edema 80/99/8338   Metabolic syndrome 25/02/3975   Hyperlipidemia 02/17/2022   Prediabetes 02/17/2022   Morbid obesity with BMI of 40.0-44.9, adult ([place]) 03/02/2017   Seborrheic keratoses 03/02/2017    Past Surgical History:  Procedure Laterality Date   ABDOMINAL HYSTERECTOMY  2006   Partial   COLONOSCOPY WITH PROPOFOL N/A 04/26/2016   Procedure: COLONOSCOPY WITH PROPOFOL;  Surgeon: Robert Bellow, MD;  Location:  [facility] ENDOSCOPY;  Service: Endoscopy;  Laterality: N/A;    Family History  Problem Relation Age of Onset   Hypertension Mother    Parkinson's disease Mother    Hypertension Sister    Hypertension Paternal Aunt        pat great aunt   Cancer Father        colon or lung ?   Breast cancer Paternal Aunt    Hernia Brother    ADD / ADHD Son    Cancer Maternal Grandfather        lung    Social History   Tobacco Use   Smoking status: Never   Smokeless tobacco: Never  Vaping Use   Vaping Use: Never used  Substance Use Topics   Alcohol use: Yes    Alcohol/week: 0.0 standard drinks of alcohol    Comment: occasional   Drug use: No     No Known Allergies  Health Maintenance  Topic Date Due   COVID-19 Vaccine (1) Never done   MAMMOGRAM  02/07/2018   COLONOSCOPY (Pts 45-57yr Insurance coverage will need to be confirmed)  04/27/2019   Zoster Vaccines- Shingrix (1 of 2) 05/18/2022 (Originally 09/15/1982)   INFLUENZA VACCINE  05/10/2022   TETANUS/TDAP  06/08/2031   Hepatitis C Screening  Completed   HIV Screening  Completed  HPV VACCINES  Aged Out   PAP SMEAR-Modifier  Discontinued    Chart Review Today: I personally reviewed active problem list, medication list, allergies, family history, social history, health maintenance, notes from last encounter, lab results, imaging with the patient/caregiver today.   Review of Systems  Constitutional: Negative.   HENT: Negative.    Eyes: Negative.   Respiratory: Negative.    Cardiovascular: Negative.   Gastrointestinal: Negative.   Endocrine: Negative.   Genitourinary: Negative.   Musculoskeletal: Negative.   Skin: Negative.   Allergic/Immunologic: Negative.   Neurological: Negative.   Hematological: Negative.   Psychiatric/Behavioral: Negative.    All other systems reviewed and are negative.    Objective:   Vitals:   04/18/22 0954  BP: 136/78  Pulse: 97  Resp: 16  Temp: (!) 97.5 F (36.4 C)  TempSrc: Oral  SpO2:  97%  Weight: 238 lb (108 kg)  Height: '5\' 4"'$  (1.626 m)    Body mass index is 40.85 kg/m.  Physical Exam Vitals and nursing note reviewed.  Constitutional:      General: She is not in acute distress.    Appearance: Normal appearance. She is well-developed. She is obese. She is not ill-appearing, toxic-appearing or diaphoretic.  HENT:     Head: Normocephalic and atraumatic.     Right Ear: External ear normal.     Left Ear: External ear normal.     Nose: Nose normal.  Eyes:     General:        Right eye: No discharge.        Left eye: No discharge.     Conjunctiva/sclera: Conjunctivae normal.  Neck:     Trachea: No tracheal deviation.  Cardiovascular:     Rate and Rhythm: Normal rate and regular rhythm.  Pulmonary:     Effort: Pulmonary effort is normal. No respiratory distress.     Breath sounds: No stridor.  Musculoskeletal:        General: Normal range of motion.  Skin:    General: Skin is warm and dry.     Findings: No rash.  Neurological:     Mental Status: She is alert.     Motor: No abnormal muscle tone.     Coordination: Coordination normal.  Psychiatric:        Behavior: Behavior normal.         Assessment & Plan:   1. Morbid obesity with BMI of 40.0-44.9, adult ([facility]) Wt Readings from Last 5 Encounters:  04/18/22 238 lb (108 kg)  03/18/22 241 lb (109.3 kg)  02/15/22 247 lb (112 kg)  06/07/21 245 lb 1.6 oz (111.2 kg)  08/06/19 218 lb (98.9 kg)   BMI Readings from Last 5 Encounters:  04/18/22 40.85 kg/m  03/18/22 41.37 kg/m  02/15/22 41.74 kg/m  06/07/21 40.79 kg/m  08/06/19 36.28 kg/m     2. Encounter for weight management Did well with starting dose, no SE, and she did loose weight Difficulty with med supply  - Semaglutide-Weight Management 0.5 MG/0.5ML SOAJ; Inject 0.5 mg into the skin once a week for 28 days.  Dispense: 2 mL; Refill: 0  3. Hyperlipidemia, unspecified hyperlipidemia type Lab Results  Component Value Date   CHOL 261  (H) 02/16/2022   HDL 75 02/16/2022   LDLCALC 159 (H) 02/16/2022   TRIG 153 (H) 02/16/2022   CHOLHDL 3.5 02/16/2022  Will recheck labs at next CPE The 10-year ASCVD risk score (Arnett DK, et al., 2019) is: 5.3%   Values  used to calculate the score:     Age: 49 years     Sex: Female     Is Non-Hispanic African American: Yes     Diabetic: No     Tobacco smoker: No     Systolic Blood Pressure: 871 mmHg     Is BP treated: No     HDL Cholesterol: 75 mg/dL     Total Cholesterol: 261 mg/dL   4. Prediabetes Lab Results  Component Value Date   HGBA1C 6.0 (H) 02/16/2022  Will recheck 8/10   5. Metabolic syndrome Meds effective - just difficulty with finding due to supply     F/up around 8/10  Delsa Grana, PA-C 04/18/22 10:12 AM

## 2022-06-20 ENCOUNTER — Other Ambulatory Visit: Payer: Self-pay | Admitting: Family Medicine

## 2022-06-20 NOTE — Telephone Encounter (Signed)
Pt is currently taking Semaglutide-Weight Management 1 MG/0.5ML SOAJ / pt needs refill for the next dose up / please [id number] [place] [address] [id number] Phone:  [phone] Fax:  [phone] DEA #:  [id number]

## 2022-06-23 MED ORDER — SEMAGLUTIDE-WEIGHT MANAGEMENT 1.7 MG/0.75ML ~~LOC~~ SOAJ: 1.7000 mg | SUBCUTANEOUS | 0 refills | Status: DC

## 2022-06-23 MED ORDER — SEMAGLUTIDE-WEIGHT MANAGEMENT 2.4 MG/0.75ML ~~LOC~~ SOAJ: 2.4000 mg | SUBCUTANEOUS | 2 refills | Status: DC

## 2022-06-23 NOTE — Telephone Encounter (Signed)
Ordered the next two doses to the requested pharmacy

## 2022-06-24 NOTE — Telephone Encounter (Signed)
Pt has been notified on waiting call or notifications from pharmacy to be ready to be picked up.

## 2022-07-19 ENCOUNTER — Encounter: Payer: Self-pay | Admitting: Family Medicine

## 2022-07-19 ENCOUNTER — Ambulatory Visit (INDEPENDENT_AMBULATORY_CARE_PROVIDER_SITE_OTHER): Payer: Managed Care, Other (non HMO) | Admitting: Family Medicine

## 2022-07-19 VITALS — BP 130/76 | HR 89 | Temp 97.9°F | Resp 16 | Ht 64.0 in | Wt 225.0 lb

## 2022-07-19 DIAGNOSIS — E669 Obesity, unspecified: Secondary | ICD-10-CM

## 2022-07-19 DIAGNOSIS — Z6838 Body mass index (BMI) 38.0-38.9, adult: Secondary | ICD-10-CM

## 2022-07-19 DIAGNOSIS — Z23 Encounter for immunization: Secondary | ICD-10-CM

## 2022-07-19 DIAGNOSIS — Z7689 Persons encountering health services in other specified circumstances: Secondary | ICD-10-CM | POA: Diagnosis not present

## 2022-07-19 DIAGNOSIS — R7303 Prediabetes: Secondary | ICD-10-CM | POA: Diagnosis not present

## 2022-07-19 DIAGNOSIS — Z1211 Encounter for screening for malignant neoplasm of colon: Secondary | ICD-10-CM

## 2022-07-19 DIAGNOSIS — Z6841 Body Mass Index (BMI) 40.0 and over, adult: Secondary | ICD-10-CM

## 2022-07-19 DIAGNOSIS — E8881 Metabolic syndrome: Secondary | ICD-10-CM | POA: Diagnosis not present

## 2022-07-19 MED ORDER — SEMAGLUTIDE-WEIGHT MANAGEMENT 1.7 MG/0.75ML ~~LOC~~ SOAJ: 1.7000 mg | SUBCUTANEOUS | 0 refills | Status: DC

## 2022-07-19 NOTE — Patient Instructions (Addendum)
Please let me know in a month or so if you are giong to increase the dose or if you need me to send in more 1.7 mg dose for you.  Continue to work on your protein and fiber goals with pushing fluids - and I agree the gradual weight loss is the best way to go.

## 2022-07-19 NOTE — Progress Notes (Signed)
Name: Sierra Buck   MRN: [id number]    DOB: 12-24-1960   Date:07/19/2022       Progress Note  Chief Complaint  Patient presents with   Follow-up   Weight Check     Subjective:   Sierra Buck is a 61 y.o. female, presents to clinic for weight/med f/up More active 30 min of movement/exercise daily Leg swelling not as bad - sometimes uses [employer] compression socks  Shes exercising, decreased overall intake with meds and doing noom Shes lost weight Wt Readings from Last 5 Encounters:  07/19/22 225 lb (102.1 kg)  04/18/22 238 lb (108 kg)  03/18/22 241 lb (109.3 kg)  02/15/22 247 lb (112 kg)  06/07/21 245 lb 1.6 oz (111.2 kg)   BMI Readings from Last 5 Encounters:  07/19/22 38.62 kg/m  04/18/22 40.85 kg/m  03/18/22 41.37 kg/m  02/15/22 41.74 kg/m  06/07/21 40.79 kg/m    Some mild GI se with the last dose increase   Current Outpatient Medications:    MULTIPLE VITAMINS PO, Take by mouth., Disp: , Rfl:    [START ON 07/29/2022] Semaglutide-Weight Management 2.4 MG/0.75ML SOAJ, Inject 2.4 mg into the skin once a week for 12 doses., Disp: 3 mL, Rfl: 2   Semaglutide-Weight Management 1.7 MG/0.75ML SOAJ, Inject 1.7 mg into the skin once a week., Disp: 3 mL, Rfl: 0  Patient Active Problem List   Diagnosis Date Noted   Unintended weight gain 02/17/2022   OSA on CPAP 02/17/2022   Bilateral lower extremity edema 78/29/5621   Metabolic syndrome 30/86/5784   Hyperlipidemia 02/17/2022   Prediabetes 02/17/2022   Morbid obesity with BMI of 40.0-44.9, adult ([facility]) 03/02/2017   Seborrheic keratoses 03/02/2017    Past Surgical History:  Procedure Laterality Date   ABDOMINAL HYSTERECTOMY  2006   Partial   COLONOSCOPY WITH PROPOFOL N/A 04/26/2016   Procedure: COLONOSCOPY WITH PROPOFOL;  Surgeon: Robert Bellow, MD;  Location: [facility];  Service: Endoscopy;  Laterality: N/A;    Family History  Problem Relation Age of Onset   Hypertension Mother     Parkinson's disease Mother    Hypertension Sister    Hypertension Paternal Aunt        pat great aunt   Cancer Father        colon or lung ?   Breast cancer Paternal Aunt    Hernia Brother    ADD / ADHD Son    Cancer Maternal Grandfather        lung    Social History   Tobacco Use   Smoking status: Never   Smokeless tobacco: Never  Vaping Use   Vaping Use: Never used  Substance Use Topics   Alcohol use: Yes    Alcohol/week: 0.0 standard drinks of alcohol    Comment: occasional   Drug use: No     No Known Allergies  Health Maintenance  Topic Date Due   MAMMOGRAM  02/07/2018   COVID-19 Vaccine (1) 08/04/2022 (Originally 09/15/1966)   Zoster Vaccines- Shingrix (1 of 2) 10/19/2022 (Originally 09/15/1982)   INFLUENZA VACCINE  01/08/2023 (Originally 05/10/2022)   COLONOSCOPY (Pts 45-63yr Insurance coverage will need to be confirmed)  07/20/2023 (Originally 04/27/2019)   TETANUS/TDAP  06/08/2031   Hepatitis C Screening  Completed   HIV Screening  Completed   HPV VACCINES  Aged Out   PAP SMEAR-Modifier  Discontinued    Chart Review Today: I personally reviewed active problem list, medication list, allergies, family history, social history,  health maintenance, notes from last encounter, lab results, imaging with the patient/caregiver today.   Review of Systems  Constitutional: Negative.   HENT: Negative.    Eyes: Negative.   Respiratory: Negative.    Cardiovascular: Negative.   Gastrointestinal: Negative.   Endocrine: Negative.   Genitourinary: Negative.   Musculoskeletal: Negative.   Skin: Negative.   Allergic/Immunologic: Negative.   Neurological: Negative.   Hematological: Negative.   Psychiatric/Behavioral: Negative.    All other systems reviewed and are negative.    Objective:   Vitals:   07/19/22 0915  BP: 130/76  Pulse: 89  Resp: 16  Temp: 97.9 F (36.6 C)  TempSrc: Oral  SpO2: 95%  Weight: 225 lb (102.1 kg)  Height: '5\' 4"'$  (1.626 m)    Body  mass index is 38.62 kg/m.  Physical Exam Vitals and nursing note reviewed.  Constitutional:      Appearance: Normal appearance. She is well-developed and well-groomed. She is obese. She is not ill-appearing, toxic-appearing or diaphoretic.  HENT:     Head: Normocephalic and atraumatic.     Nose: Nose normal.  Eyes:     General:        Right eye: No discharge.        Left eye: No discharge.     Conjunctiva/sclera: Conjunctivae normal.  Neck:     Trachea: No tracheal deviation.  Cardiovascular:     Rate and Rhythm: Normal rate and regular rhythm.     Pulses: Normal pulses.     Heart sounds: Normal heart sounds.  Pulmonary:     Effort: Pulmonary effort is normal. No respiratory distress.     Breath sounds: Normal breath sounds. No stridor.  Musculoskeletal:        General: Normal range of motion.  Skin:    General: Skin is warm and dry.     Findings: No rash.  Neurological:     Mental Status: She is alert.     Motor: No abnormal muscle tone.     Coordination: Coordination normal.  Psychiatric:        Behavior: Behavior normal. Behavior is cooperative.         Assessment & Plan:     ICD-10-CM   1. Morbid obesity with BMI of 40.0-44.9, adult ([facility])  E66.01 Semaglutide-Weight Management 1.7 MG/0.75ML SOAJ   Z68.41    weight down to new BMI category -will be changed on problem list    2. Prediabetes  R73.03 Hemoglobin A1c    Semaglutide-Weight Management 1.7 MG/0.75ML SOAJ    3. Encounter for weight management  Z76.89 Semaglutide-Weight Management 1.7 MG/0.75ML SOAJ   weight loss of just over 5% on meds, SE tolerable, significant diet and lifestyle efforts as well    4. Metabolic syndrome  Y48.250 Semaglutide-Weight Management 1.7 MG/0.75ML SOAJ   recheck labs in a few months, doing well with meds and diet/lifestyle efforts      Pt will notify Sierra Buck in the next 1-2 months on what dose she tolerates and when she needs refills Meds have been effective and well tolerated  and she has lost 13 lbs  Wt Readings from Last 5 Encounters:  07/19/22 225 lb (102.1 kg)  04/18/22 238 lb (108 kg)  03/18/22 241 lb (109.3 kg)  02/15/22 247 lb (112 kg)  06/07/21 245 lb 1.6 oz (111.2 kg)   BMI Readings from Last 5 Encounters:  07/19/22 38.62 kg/m  04/18/22 40.85 kg/m  03/18/22 41.37 kg/m  02/15/22 41.74 kg/m  06/07/21 40.79 kg/m  Return in about 6 months (around 01/18/2023) for Annual Physical.   Delsa Grana, PA-C 07/19/22 9:29 AM

## 2022-07-21 ENCOUNTER — Encounter: Payer: Self-pay | Admitting: Family Medicine

## 2022-07-21 DIAGNOSIS — Z6839 Body mass index (BMI) 39.0-39.9, adult: Secondary | ICD-10-CM | POA: Insufficient documentation

## 2022-07-21 DIAGNOSIS — E66812 Obesity, class 2: Secondary | ICD-10-CM | POA: Insufficient documentation

## 2022-07-21 DIAGNOSIS — E669 Obesity, unspecified: Secondary | ICD-10-CM | POA: Insufficient documentation

## 2022-08-10 ENCOUNTER — Other Ambulatory Visit: Payer: Self-pay | Admitting: Family Medicine

## 2022-08-10 DIAGNOSIS — Z7689 Persons encountering health services in other specified circumstances: Secondary | ICD-10-CM

## 2022-08-10 DIAGNOSIS — E8881 Metabolic syndrome: Secondary | ICD-10-CM

## 2022-08-10 DIAGNOSIS — R7303 Prediabetes: Secondary | ICD-10-CM

## 2022-08-11 MED ORDER — SEMAGLUTIDE-WEIGHT MANAGEMENT 1.7 MG/0.75ML ~~LOC~~ SOAJ: 1.7000 mg | SUBCUTANEOUS | 0 refills | Status: DC

## 2022-08-22 ENCOUNTER — Encounter: Payer: Self-pay | Admitting: Nurse Practitioner

## 2022-08-22 ENCOUNTER — Ambulatory Visit: Payer: Managed Care, Other (non HMO) | Admitting: Nurse Practitioner

## 2022-08-22 VITALS — BP 128/76 | HR 74 | Ht 64.5 in | Wt 221.4 lb

## 2022-08-22 DIAGNOSIS — G4733 Obstructive sleep apnea (adult) (pediatric): Secondary | ICD-10-CM | POA: Diagnosis not present

## 2022-08-22 DIAGNOSIS — E669 Obesity, unspecified: Secondary | ICD-10-CM | POA: Diagnosis not present

## 2022-08-22 NOTE — Progress Notes (Signed)
$'@Patient'M$  ID: [place], female    DOB: 1961-02-01, 61 y.o.   MRN: [id number]  Chief Complaint  Patient presents with   Consult    Pt sleep consult, she hasn't used the machine in 54mo She was dx w/ OSA in 2005 [place]    Referring provider: TDelsa Grana PA-C  HPI: 61year old female, never smoker referred for sleep consult. Past medical history significant for OSA on CPAP, obesity, HLD, prediabetes.   TEST/EVENTS:   08/22/2022: Today - sleep consult Patient presents today for sleep consult. She has a longstanding history of OSA. Started on CPAP around 15 years ago. She has had the same machine since. Unfortunately, her mask broke around 5 months ago. She's also worried her machine is getting ready to die but it does still turn on. She has noticed feeling more fatigued since being off her CPAP. She also has been told by her husband that she's snoring again. She denies any drowsy driving, morning headaches, sleep parasomnias/paralysis, witness apneas. No history of narcolepsy or cataplexy.  She goes to bed around 10-11pm. Falls asleep within 30 min. Gets up 3-4 times to use the bathroom. Officially wakes up around 7:30 am. No sleep aids. [place] she believes the place has closed down now and is not sure about the severity of her sleep apnea. Her weight is up probably 10 lb since this study. She is working on weight loss right now with semaglutide; down 30 lb.  She has a history of hypertension. No history of stroke. She had a partial hysterectomy in 2004. Family history of heart disease and cancer. She is a never smoker. Drinks on rare occasion.  She lives at home with her husband. She is a hAgricultural engineer Planning to go on a trip with [place] girlfriends [place] [facility]   [place] [address]  No Known Allergies  Immunization History  Administered Date(s) Administered   Influenza,inj,Quad PF,6+ Mos 08/27/2015,  07/13/2018   Tdap 06/07/2021   Zoster Recombinat (Shingrix) 04/18/2021    Past Medical History:  Diagnosis Date   Allergy    Bilateral leg edema    Lymphedema of both lower extremities    Obesity (BMI 35.0-39.9 without comorbidity) 03/02/2017   Sleep apnea    CPAP    Tobacco History: Social History   Tobacco Use  Smoking Status Never  Smokeless Tobacco Never   Counseling given: Not Answered   Outpatient Medications Prior to Visit  Medication Sig Dispense Refill   MULTIPLE VITAMINS PO Take by mouth.     Semaglutide-Weight Management 1.7 MG/0.75ML SOAJ Inject 1.7 mg into the skin once a week. 3 mL 0   Semaglutide-Weight Management 2.4 MG/0.75ML SOAJ Inject 2.4 mg into the skin once a week for 12 doses. (Patient not taking: Reported on 08/22/2022) 3 mL 2   No facility-administered medications prior to visit.     Review of Systems:   Constitutional: No weight loss or gain, night sweats, fevers, chills, or lassitude. +fatigue HEENT: No headaches, difficulty swallowing, tooth/dental problems, or sore throat. No sneezing, itching, ear ache, nasal congestion, or post nasal drip CV:  No chest pain, orthopnea, PND, swelling in lower extremities, anasarca, dizziness, palpitations, syncope Resp: +snoring. No shortness of breath with exertion or at rest. No excess mucus or change in color of mucus. No productive or non-productive. No hemoptysis. No wheezing.  No chest wall deformity GI:  No  heartburn, indigestion, abdominal pain GU: No dysuria, change in color of urine, urgency or frequency Skin: No rash, lesions, ulcerations MSK:  No joint pain or swelling.  No decreased range of motion.  No back pain. Neuro: No dizziness or lightheadedness.  Psych: No depression or anxiety. Mood stable. +sleep disturbance    Physical Exam:  BP 128/76   Pulse 74   Ht 5' 4.5" (1.638 m)   Wt 221 lb 6.4 oz (100.4 kg)   SpO2 100%   BMI 37.42 kg/m   GEN: Pleasant, interactive,  well-appearing; obese; in no acute distress. HEENT:  Normocephalic and atraumatic. PERRLA. Sclera white. Nasal turbinates pink, moist and patent bilaterally. No rhinorrhea present. Oropharynx pink and moist, without exudate or edema. No lesions, ulcerations, or postnasal drip. Mallampati II/III NECK:  Supple w/ fair ROM. No JVD present. Normal carotid impulses w/o bruits. Thyroid symmetrical with no goiter or nodules palpated. No lymphadenopathy.   CV: RRR, no m/r/g, no peripheral edema. Pulses intact, +2 bilaterally. No cyanosis, pallor or clubbing. PULMONARY:  Unlabored, regular breathing. Clear bilaterally A&P w/o wheezes/rales/rhonchi. No accessory muscle use.  GI: BS present and normoactive. Soft, non-tender to palpation. No organomegaly or masses detected.  MSK: No erythema, warmth or tenderness. Cap refil <2 sec all extrem. No deformities or joint swelling noted.  Neuro: A/Ox3. No focal deficits noted.   Skin: Warm, no lesions or rashe Psych: Normal affect and behavior. Judgement and thought content appropriate.     Lab Results:  CBC    Component Value Date/Time   WBC 5.1 02/16/2022 1409   RBC 4.78 02/16/2022 1409   HGB 13.5 02/16/2022 1409   HCT 41.3 02/16/2022 1409   PLT 289 02/16/2022 1409   MCV 86 02/16/2022 1409   MCH 28.2 02/16/2022 1409   MCHC 32.7 02/16/2022 1409   RDW 13.2 02/16/2022 1409   LYMPHSABS 2.5 02/16/2022 1409   EOSABS 0.3 02/16/2022 1409   BASOSABS 0.0 02/16/2022 1409    BMET    Component Value Date/Time   NA 140 02/16/2022 1409   K 3.9 02/16/2022 1409   CL 101 02/16/2022 1409   CO2 24 02/16/2022 1409   GLUCOSE 83 02/16/2022 1409   BUN 13 02/16/2022 1409   CREATININE 0.71 02/16/2022 1409   CALCIUM 9.5 02/16/2022 1409   GFRNONAA 86 08/06/2019 1138   GFRAA 99 08/06/2019 1138    BNP No results found for: "BNP"   Imaging:  No results found.        No data to display          No results found for:  "NITRICOXIDE"      Assessment & Plan:   OSA on CPAP OSA on CPAP. Unfortunately, we do not have access to her previous sleep study. Her CPAP machine is very old; close to 15 years. No SD card. She has not been using it over the past five months due to running out of supplies. She would like to get restarted on therapy and get a new machine as well. I will provide her with a rx to get a new mask today. She will need a repeat sleep study since we do not have access to her previous and no download is available from her current machine. Orders placed today.    - discussed how weight can impact sleep and risk for sleep disordered breathing - discussed options to assist with weight loss: combination of diet modification, cardiovascular and strength training exercises   - had an extensive  discussion regarding the adverse health consequences related to untreated sleep disordered breathing - specifically discussed the risks for hypertension, coronary artery disease, cardiac dysrhythmias, cerebrovascular disease, and diabetes - lifestyle modification discussed   - discussed how sleep disruption can increase risk of accidents, particularly when driving - safe driving practices were discussed  Patient Instructions  Restart CPAP every night, minimum of 4-6 hours a night.  Change equipment every 30 days or as directed by DME. Wash your tubing with warm soap and water daily, hang to dry. Wash humidifier portion weekly.  Be aware of reduced alertness and do not drive or operate heavy machinery if experiencing this or drowsiness.  Exercise encouraged, as tolerated. Notify if persistent daytime sleepiness occurs even with consistent use of CPAP.  Provided with a prescription for a new mask today to see if you can restart on your current machine  We will obtain an in lab sleep study so we can get up to date information and a new CPAP machine for you  Follow up in 12 weeks with Joellen Jersey Kamiyah Kindel,NP or sooner if  needed    Obesity (BMI 30-39.9) BMI 37. She is working on weight loss measures with Ozempic. Healthy weight loss measures encouraged.    I spent 35 minutes of dedicated to the care of this patient on the date of this encounter to include pre-visit review of records, face-to-face time with the patient discussing conditions above, post visit ordering of testing, clinical documentation with the electronic health record, making appropriate referrals as documented, and communicating necessary findings to members of the patients care team.  Clayton Bibles, NP 08/26/2022  Pt aware and understands NP's role.

## 2022-08-22 NOTE — Patient Instructions (Signed)
Restart CPAP every night, minimum of 4-6 hours a night.  Change equipment every 30 days or as directed by DME. Wash your tubing with warm soap and water daily, hang to dry. Wash humidifier portion weekly.  Be aware of reduced alertness and do not drive or operate heavy machinery if experiencing this or drowsiness.  Exercise encouraged, as tolerated. Notify if persistent daytime sleepiness occurs even with consistent use of CPAP.  Provided with a prescription for a new mask today to see if you can restart on your current machine  We will obtain an in lab sleep study so we can get up to date information and a new CPAP machine for you  Follow up in 12 weeks with Sierra Jersey Cecillia Menees,NP or sooner if needed

## 2022-08-26 ENCOUNTER — Encounter: Payer: Self-pay | Admitting: Nurse Practitioner

## 2022-08-26 DIAGNOSIS — E669 Obesity, unspecified: Secondary | ICD-10-CM | POA: Insufficient documentation

## 2022-08-26 NOTE — Progress Notes (Signed)
08/26/2022 Addendum: Received notification from her Insurance that in lab study was denied. She will have to undergo HST. Pt agreeable to this. Orders placed today.

## 2022-08-26 NOTE — Assessment & Plan Note (Signed)
OSA on CPAP. Unfortunately, we do not have access to her previous sleep study. Her CPAP machine is very old; close to 15 years. No SD card. She has not been using it over the past five months due to running out of supplies. She would like to get restarted on therapy and get a new machine as well. I will provide her with a rx to get a new mask today. She will need a repeat sleep study since we do not have access to her previous and no download is available from her current machine. Orders placed today.    - discussed how weight can impact sleep and risk for sleep disordered breathing - discussed options to assist with weight loss: combination of diet modification, cardiovascular and strength training exercises   - had an extensive discussion regarding the adverse health consequences related to untreated sleep disordered breathing - specifically discussed the risks for hypertension, coronary artery disease, cardiac dysrhythmias, cerebrovascular disease, and diabetes - lifestyle modification discussed   - discussed how sleep disruption can increase risk of accidents, particularly when driving - safe driving practices were discussed  Patient Instructions  Restart CPAP every night, minimum of 4-6 hours a night.  Change equipment every 30 days or as directed by DME. Wash your tubing with warm soap and water daily, hang to dry. Wash humidifier portion weekly.  Be aware of reduced alertness and do not drive or operate heavy machinery if experiencing this or drowsiness.  Exercise encouraged, as tolerated. Notify if persistent daytime sleepiness occurs even with consistent use of CPAP.  Provided with a prescription for a new mask today to see if you can restart on your current machine  We will obtain an in lab sleep study so we can get up to date information and a new CPAP machine for you  Follow up in 12 weeks with Joellen Jersey Enedelia Martorelli,NP or sooner if needed

## 2022-08-26 NOTE — Assessment & Plan Note (Signed)
BMI 37. She is working on weight loss measures with Ozempic. Healthy weight loss measures encouraged.

## 2022-08-26 NOTE — Progress Notes (Signed)
Reviewed and agree with assessment/plan.   Chesley Mires, MD [facility] Pulmonary/Critical Care 08/26/2022, 10:19 AM Pager:  [phone]

## 2022-09-29 ENCOUNTER — Other Ambulatory Visit: Payer: Self-pay | Admitting: Family Medicine

## 2022-09-29 DIAGNOSIS — Z7689 Persons encountering health services in other specified circumstances: Secondary | ICD-10-CM

## 2022-09-29 DIAGNOSIS — E8881 Metabolic syndrome: Secondary | ICD-10-CM

## 2022-09-29 DIAGNOSIS — R7303 Prediabetes: Secondary | ICD-10-CM

## 2022-09-30 ENCOUNTER — Other Ambulatory Visit: Payer: Self-pay | Admitting: Family Medicine

## 2022-09-30 DIAGNOSIS — R7303 Prediabetes: Secondary | ICD-10-CM

## 2022-09-30 DIAGNOSIS — E8881 Metabolic syndrome: Secondary | ICD-10-CM

## 2022-09-30 DIAGNOSIS — Z7689 Persons encountering health services in other specified circumstances: Secondary | ICD-10-CM

## 2022-10-04 MED ORDER — SEMAGLUTIDE-WEIGHT MANAGEMENT 1.7 MG/0.75ML ~~LOC~~ SOAJ: 1.7000 mg | SUBCUTANEOUS | 0 refills | Status: DC

## 2022-11-02 ENCOUNTER — Encounter: Payer: Self-pay | Admitting: Family Medicine

## 2022-11-04 ENCOUNTER — Other Ambulatory Visit: Payer: Self-pay | Admitting: Family Medicine

## 2022-11-04 DIAGNOSIS — E669 Obesity, unspecified: Secondary | ICD-10-CM

## 2022-11-04 MED ORDER — SEMAGLUTIDE-WEIGHT MANAGEMENT 2.4 MG/0.75ML ~~LOC~~ SOAJ: 2.4000 mg | SUBCUTANEOUS | 0 refills | Status: DC

## 2022-11-21 ENCOUNTER — Telehealth: Payer: Self-pay

## 2022-11-21 NOTE — Telephone Encounter (Signed)
Called pt in regards to her upcoming appt 2/13, HST has not been completed and need pt to reschedule unless she needs to see [facility] for another reason.

## 2022-11-22 ENCOUNTER — Ambulatory Visit: Payer: Managed Care, Other (non HMO) | Admitting: Nurse Practitioner

## 2022-11-23 ENCOUNTER — Ambulatory Visit: Payer: Managed Care, Other (non HMO)

## 2022-11-23 DIAGNOSIS — G4733 Obstructive sleep apnea (adult) (pediatric): Secondary | ICD-10-CM

## 2022-12-02 DIAGNOSIS — G4733 Obstructive sleep apnea (adult) (pediatric): Secondary | ICD-10-CM | POA: Diagnosis not present

## 2022-12-06 ENCOUNTER — Ambulatory Visit: Payer: Managed Care, Other (non HMO) | Admitting: Nurse Practitioner

## 2022-12-12 ENCOUNTER — Ambulatory Visit: Payer: Managed Care, Other (non HMO) | Admitting: Nurse Practitioner

## 2022-12-12 ENCOUNTER — Encounter: Payer: Self-pay | Admitting: Nurse Practitioner

## 2022-12-12 VITALS — BP 114/74 | HR 68 | Ht 64.0 in | Wt 211.2 lb

## 2022-12-12 DIAGNOSIS — E669 Obesity, unspecified: Secondary | ICD-10-CM | POA: Diagnosis not present

## 2022-12-12 DIAGNOSIS — G4733 Obstructive sleep apnea (adult) (pediatric): Secondary | ICD-10-CM

## 2022-12-12 NOTE — Assessment & Plan Note (Signed)
Moderate OSA. She has a longstanding history of OSA and used CPAP for many years. Machine is around 60 years old. We will send orders for new CPAP auto 5-15 cmH2O. She would like to try alternative mask options. Reviewed today and landed on nasal pillow mask. Educated on proper use and care. Cautioned on safe driving practices.   Patient Instructions  Restart CPAP 5-15 cmH2O every night, minimum of 4-6 hours a night.  Change equipment every 30 days or as directed by DME. Wash your tubing with warm soap and water daily, hang to dry. Wash humidifier portion weekly.  Be aware of reduced alertness and do not drive or operate heavy machinery if experiencing this or drowsiness.  Exercise encouraged, as tolerated. Avoid or decrease alcohol consumption and medications that make you more sleepy, if possible. Notify if persistent daytime sleepiness occurs even with consistent use of CPAP.  We discussed how untreated sleep apnea puts an individual at risk for cardiac arrhthymias, pulm HTN, DM, stroke and increases their risk for daytime accidents. We also briefly reviewed treatment options including weight loss, side sleeping position, oral appliance, CPAP therapy or referral to ENT for possible surgical options  Follow up in 12 weeks with Dr. Ander Slade (new pt 30 min slot), or sooner, if needed

## 2022-12-12 NOTE — Assessment & Plan Note (Signed)
BMI 36. Healthy weight loss encouraged.

## 2022-12-12 NOTE — Patient Instructions (Signed)
Restart CPAP 5-15 cmH2O every night, minimum of 4-6 hours a night.  Change equipment every 30 days or as directed by DME. Wash your tubing with warm soap and water daily, hang to dry. Wash humidifier portion weekly.  Be aware of reduced alertness and do not drive or operate heavy machinery if experiencing this or drowsiness.  Exercise encouraged, as tolerated. Avoid or decrease alcohol consumption and medications that make you more sleepy, if possible. Notify if persistent daytime sleepiness occurs even with consistent use of CPAP.  We discussed how untreated sleep apnea puts an individual at risk for cardiac arrhthymias, pulm HTN, DM, stroke and increases their risk for daytime accidents. We also briefly reviewed treatment options including weight loss, side sleeping position, oral appliance, CPAP therapy or referral to ENT for possible surgical options  Follow up in 12 weeks with Dr. Ander Slade (new pt 30 min slot), or sooner, if needed

## 2022-12-12 NOTE — Progress Notes (Signed)
$'@Patient'P$  ID: Sierra Buck, female    DOB: 1961-06-20, 62 y.o.   MRN: [id number]  Chief Complaint  Patient presents with   Follow-up    Pt f/u for OSA to discuss HST.     Referring provider: Delsa Grana, PA-C  HPI: 62 year old female, never smoker referred for sleep consult. Past medical history significant for OSA on CPAP, obesity, HLD, prediabetes.   TEST/EVENTS:  11/24/2022 HST: AHI 24.2/h, SpO2 low 77%, av 90%  08/22/2022: OV with Sierra Faircloth NP for sleep consult. She has a longstanding history of OSA. Started on CPAP around 15 years ago. She has had the same machine since. Unfortunately, her mask broke around 5 months ago. She's also worried her machine is getting ready to die but it does still turn on. She has noticed feeling more fatigued since being off her CPAP. She also has been told by her husband that she's snoring again. She denies any drowsy driving, morning headaches, sleep parasomnias/paralysis, witness apneas. No history of narcolepsy or cataplexy.  She goes to bed around 10-11pm. Falls asleep within 30 min. Gets up 3-4 times to use the bathroom. Officially wakes up around 7:30 am. No sleep aids. Last sleep study was around 15 years ago in [place]; she believes the place has closed down now and is not sure about the severity of her sleep apnea. Her weight is up probably 10 lb since this study. She is working on weight loss right now with semaglutide; down 30 lb.  She has a history of hypertension. No history of stroke. She had a partial hysterectomy in 2004. Family history of heart disease and cancer. She is a never smoker. Drinks on rare occasion.  She lives at home with her husband. She is a Agricultural engineer. Planning to go on a trip with her girlfriends next month to see [facility].  [place] 11  11/13/2022: Today - follow up Patient presents today for follow up to discuss home sleep study results, which revealed moderate sleep apnea. She feels unchanged compared to when she was  here last. Feels like without her CPAP, she is more tired and she's snoring. She would like to get a new machine and restart therapy. Denies drowsy driving, morning headaches, sleep parasomnias/paralysis, witnessed apneas.   No Known Allergies  Immunization History  Administered Date(s) Administered   Influenza,inj,Quad PF,6+ Mos 08/27/2015, 07/13/2018   Tdap 06/07/2021   Zoster Recombinat (Shingrix) 04/18/2021    Past Medical History:  Diagnosis Date   Allergy    Bilateral leg edema    Lymphedema of both lower extremities    Obesity (BMI 35.0-39.9 without comorbidity) 03/02/2017   Sleep apnea    CPAP    Tobacco History: Social History   Tobacco Use  Smoking Status Never  Smokeless Tobacco Never   Counseling given: Not Answered   Outpatient Medications Prior to Visit  Medication Sig Dispense Refill   MULTIPLE VITAMINS PO Take by mouth.     [START ON 02/28/2023] Semaglutide-Weight Management 2.4 MG/0.75ML SOAJ Inject 2.4 mg into the skin once a week. 9 mL 0   No facility-administered medications prior to visit.     Review of Systems:   Constitutional: No weight loss or gain, night sweats, fevers, chills, or lassitude. +fatigue HEENT: No headaches, difficulty swallowing, tooth/dental problems, or sore throat. No sneezing, itching, ear ache, nasal congestion, or post nasal drip CV:  No chest pain, orthopnea, PND, swelling in lower extremities, anasarca, dizziness, palpitations, syncope Resp: +snoring. No shortness of  breath with exertion or at rest. No excess mucus or change in color of mucus. No productive or non-productive. No hemoptysis. No wheezing.  No chest wall deformity GI:  No heartburn, indigestion, abdominal pain GU: No dysuria, change in color of urine, urgency or frequency Skin: No rash, lesions, ulcerations MSK:  No joint pain or swelling.  No decreased range of motion.  No back pain. Neuro: No dizziness or lightheadedness.  Psych: No depression or anxiety.  Mood stable. +sleep disturbance    Physical Exam:  BP 114/74   Pulse 68   Ht '5\' 4"'$  (1.626 m)   Wt 211 lb 3.2 oz (95.8 kg)   SpO2 97%   BMI 36.25 kg/m   GEN: Pleasant, interactive, well-appearing; obese; in no acute distress. HEENT:  Normocephalic and atraumatic. PERRLA. Sclera white. Nasal turbinates pink, moist and patent bilaterally. No rhinorrhea present. Oropharynx pink and moist, without exudate or edema. No lesions, ulcerations, or postnasal drip. Mallampati II/III NECK:  Supple w/ fair ROM. No JVD present. Normal carotid impulses w/o bruits. Thyroid symmetrical with no goiter or nodules palpated. No lymphadenopathy.   CV: RRR, no m/r/g, no peripheral edema. Pulses intact, +2 bilaterally. No cyanosis, pallor or clubbing. PULMONARY:  Unlabored, regular breathing. Clear bilaterally A&P w/o wheezes/rales/rhonchi. No accessory muscle use.  GI: BS present and normoactive. Soft, non-tender to palpation. No organomegaly or masses detected.  MSK: No erythema, warmth or tenderness. Cap refil <2 sec all extrem. No deformities or joint swelling noted.  Neuro: A/Ox3. No focal deficits noted.   Skin: Warm, no lesions or rashe Psych: Normal affect and behavior. Judgement and thought content appropriate.     Lab Results:  CBC    Component Value Date/Time   WBC 5.1 02/16/2022 1409   RBC 4.78 02/16/2022 1409   HGB 13.5 02/16/2022 1409   HCT 41.3 02/16/2022 1409   PLT 289 02/16/2022 1409   MCV 86 02/16/2022 1409   MCH 28.2 02/16/2022 1409   MCHC 32.7 02/16/2022 1409   RDW 13.2 02/16/2022 1409   LYMPHSABS 2.5 02/16/2022 1409   EOSABS 0.3 02/16/2022 1409   BASOSABS 0.0 02/16/2022 1409    BMET    Component Value Date/Time   NA 140 02/16/2022 1409   K 3.9 02/16/2022 1409   CL 101 02/16/2022 1409   CO2 24 02/16/2022 1409   GLUCOSE 83 02/16/2022 1409   BUN 13 02/16/2022 1409   CREATININE 0.71 02/16/2022 1409   CALCIUM 9.5 02/16/2022 1409   GFRNONAA 86 08/06/2019 1138   GFRAA  99 08/06/2019 1138    BNP No results found for: "BNP"   Imaging:  No results found.        No data to display          No results found for: "NITRICOXIDE"      Assessment & Plan:   OSA on CPAP Moderate OSA. She has a longstanding history of OSA and used CPAP for many years. Machine is around 60 years old. We will send orders for new CPAP auto 5-15 cmH2O. She would like to try alternative mask options. Reviewed today and landed on nasal pillow mask. Educated on proper use and care. Cautioned on safe driving practices.   Patient Instructions  Restart CPAP 5-15 cmH2O every night, minimum of 4-6 hours a night.  Change equipment every 30 days or as directed by DME. Wash your tubing with warm soap and water daily, hang to dry. Wash humidifier portion weekly.  Be aware of reduced alertness  and do not drive or operate heavy machinery if experiencing this or drowsiness.  Exercise encouraged, as tolerated. Avoid or decrease alcohol consumption and medications that make you more sleepy, if possible. Notify if persistent daytime sleepiness occurs even with consistent use of CPAP.  We discussed how untreated sleep apnea puts an individual at risk for cardiac arrhthymias, pulm HTN, DM, stroke and increases their risk for daytime accidents. We also briefly reviewed treatment options including weight loss, side sleeping position, oral appliance, CPAP therapy or referral to ENT for possible surgical options  Follow up in 12 weeks with Dr. Ander Slade (new pt 30 min slot), or sooner, if needed    Obesity (BMI 30-39.9) BMI 36. Healthy weight loss encouraged.     I spent 28 minutes of dedicated to the care of this patient on the date of this encounter to include pre-visit review of records, face-to-face time with the patient discussing conditions above, post visit ordering of testing, clinical documentation with the electronic health record, making appropriate referrals as documented, and  communicating necessary findings to members of the patients care team.  Clayton Bibles, NP 12/12/2022  Pt aware and understands NP's role.

## 2022-12-15 ENCOUNTER — Telehealth: Payer: Self-pay | Admitting: Nurse Practitioner

## 2022-12-15 NOTE — Telephone Encounter (Signed)
Patient states [place] is not covered by insurance. Needs new company for CPAP machine. Patient phone number is [phone].

## 2022-12-16 NOTE — Telephone Encounter (Signed)
Can we send CPAP order to a new DME company. [place] apoth is no longer covered with insurance

## 2023-01-25 NOTE — Patient Instructions (Incomplete)
Preventive Care 40-60 Years Old, Female Preventive care refers to lifestyle choices and visits with your health care provider that can promote health and wellness. Preventive care visits are also called wellness exams. What can I expect for my preventive care visit? Counseling Your health care provider may ask you questions about your: Medical history, including: Past medical problems. Family medical history. Pregnancy history. Current health, including: Menstrual cycle. Method of birth control. Emotional well-being. Home life and relationship well-being. Sexual activity and sexual health. Lifestyle, including: Alcohol, nicotine or tobacco, and drug use. Access to firearms. Diet, exercise, and sleep habits. Work and work environment. Sunscreen use. Safety issues such as seatbelt and bike helmet use. Physical exam Your health care provider will check your: Height and weight. These may be used to calculate your BMI (body mass index). BMI is a measurement that tells if you are at a healthy weight. Waist circumference. This measures the distance around your waistline. This measurement also tells if you are at a healthy weight and may help predict your risk of certain diseases, such as type 2 diabetes and high blood pressure. Heart rate and blood pressure. Body temperature. Skin for abnormal spots. What immunizations do I need?  Vaccines are usually given at various ages, according to a schedule. Your health care provider will recommend vaccines for you based on your age, medical history, and lifestyle or other factors, such as travel or where you work. What tests do I need? Screening Your health care provider may recommend screening tests for certain conditions. This may include: Lipid and cholesterol levels. Diabetes screening. This is done by checking your blood sugar (glucose) after you have not eaten for a while (fasting). Pelvic exam and Pap test. Hepatitis B test. Hepatitis C  test. HIV (human immunodeficiency virus) test. STI (sexually transmitted infection) testing, if you are at risk. Lung cancer screening. Colorectal cancer screening. Mammogram. Talk with your health care provider about when you should start having regular mammograms. This may depend on whether you have a family history of breast cancer. BRCA-related cancer screening. This may be done if you have a family history of breast, ovarian, tubal, or peritoneal cancers. Bone density scan. This is done to screen for osteoporosis. Talk with your health care provider about your test results, treatment options, and if necessary, the need for more tests. Follow these instructions at home: Eating and drinking  Eat a diet that includes fresh fruits and vegetables, whole grains, lean protein, and low-fat dairy products. Take vitamin and mineral supplements as recommended by your health care provider. Do not drink alcohol if: Your health care provider tells you not to drink. You are pregnant, may be pregnant, or are planning to become pregnant. If you drink alcohol: Limit how much you have to 0-1 drink a day. Know how much alcohol is in your drink. In the [place] one drink equals one 12 oz bottle of beer (355 mL), one 5 oz glass of wine (148 mL), or one 1 oz glass of hard liquor (44 mL). Lifestyle Brush your teeth every morning and night with fluoride toothpaste. Floss one time each day. Exercise for at least 30 minutes 5 or more days each week. Do not use any products that contain nicotine or tobacco. These products include cigarettes, chewing tobacco, and vaping devices, such as e-cigarettes. If you need help quitting, ask your health care provider. Do not use drugs. If you are sexually active, practice safe sex. Use a condom or other form of protection to   prevent STIs. If you do not wish to become pregnant, use a form of birth control. If you plan to become pregnant, see your health care provider for a  prepregnancy visit. Take aspirin only as told by your health care provider. Make sure that you understand how much to take and what form to take. Work with your health care provider to find out whether it is safe and beneficial for you to take aspirin daily. Find healthy ways to manage stress, such as: Meditation, yoga, or listening to music. Journaling. Talking to a trusted person. Spending time with friends and family. Minimize exposure to UV radiation to reduce your risk of skin cancer. Safety Always wear your seat belt while driving or riding in a vehicle. Do not drive: If you have been drinking alcohol. Do not ride with someone who has been drinking. When you are tired or distracted. While texting. If you have been using any mind-altering substances or drugs. Wear a helmet and other protective equipment during sports activities. If you have firearms in your house, make sure you follow all gun safety procedures. Seek help if you have been physically or sexually abused. What's next? Visit your health care provider once a year for an annual wellness visit. Ask your health care provider how often you should have your eyes and teeth checked. Stay up to date on all vaccines. This information is not intended to replace advice given to you by your health care provider. Make sure you discuss any questions you have with your health care provider. Document Revised: 03/24/2021 Document Reviewed: 03/24/2021 Elsevier Patient Education  2023 [employer]  

## 2023-01-26 ENCOUNTER — Encounter: Payer: Self-pay | Admitting: Family Medicine

## 2023-01-26 ENCOUNTER — Ambulatory Visit (INDEPENDENT_AMBULATORY_CARE_PROVIDER_SITE_OTHER): Payer: Managed Care, Other (non HMO) | Admitting: Family Medicine

## 2023-01-26 VITALS — BP 128/80 | HR 73 | Temp 97.7°F | Resp 16 | Ht 63.75 in | Wt 213.1 lb

## 2023-01-26 DIAGNOSIS — Z1231 Encounter for screening mammogram for malignant neoplasm of breast: Secondary | ICD-10-CM

## 2023-01-26 DIAGNOSIS — E669 Obesity, unspecified: Secondary | ICD-10-CM | POA: Diagnosis not present

## 2023-01-26 DIAGNOSIS — Z1211 Encounter for screening for malignant neoplasm of colon: Secondary | ICD-10-CM

## 2023-01-26 DIAGNOSIS — Z0000 Encounter for general adult medical examination without abnormal findings: Secondary | ICD-10-CM | POA: Diagnosis not present

## 2023-01-26 DIAGNOSIS — R7303 Prediabetes: Secondary | ICD-10-CM | POA: Diagnosis not present

## 2023-01-26 DIAGNOSIS — Z6838 Body mass index (BMI) 38.0-38.9, adult: Secondary | ICD-10-CM

## 2023-01-26 DIAGNOSIS — Z780 Asymptomatic menopausal state: Secondary | ICD-10-CM

## 2023-01-26 MED ORDER — SEMAGLUTIDE-WEIGHT MANAGEMENT 2.4 MG/0.75ML ~~LOC~~ SOAJ
2.4000 mg | SUBCUTANEOUS | 3 refills | Status: DC
Start: 2023-02-28 — End: 2023-03-13

## 2023-01-26 NOTE — Progress Notes (Signed)
Patient: Sierra Buck, Female    DOB: [birth year], 62 y.o.   MRN: [id number] Danelle Berry, PA-C Visit Date: 01/26/2023  Today's Provider: Danelle Berry, PA-C   Chief Complaint  Patient presents with   Annual Exam   Subjective:   Annual physical exam:  Sierra Buck is a 62 y.o. female who presents today for complete physical exam:  Exercise/Activity:  starting to be more active Diet/nutrition:  sig diet efforts Sleep:  great, no concerns  Weight/semaglutide meds with Dr. Carlynn Purl She has lost about 34+ lbs with diet and weightloss efforts and meds Wt Readings from Last 10 Encounters:  01/26/23 213 lb 1.6 oz (96.7 kg)  12/12/22 211 lb 3.2 oz (95.8 kg)  08/22/22 221 lb 6.4 oz (100.4 kg)  07/19/22 225 lb (102.1 kg)  04/18/22 238 lb (108 kg)  03/18/22 241 lb (109.3 kg)  02/15/22 247 lb (112 kg)  06/07/21 245 lb 1.6 oz (111.2 kg)  08/06/19 218 lb (98.9 kg)  07/30/18 218 lb (98.9 kg)   BMI Readings from Last 5 Encounters:  01/26/23 36.87 kg/m  12/12/22 36.25 kg/m  08/22/22 37.42 kg/m  07/19/22 38.62 kg/m  04/18/22 40.85 kg/m     SDOH Screenings   Food Insecurity: No Food Insecurity (01/26/2023)  Housing: Low Risk  (01/26/2023)  Transportation Needs: No Transportation Needs (06/07/2021)  Utilities: Not At Risk (01/26/2023)  Alcohol Screen: Low Risk  (01/26/2023)  Depression (PHQ2-9): Low Risk  (01/26/2023)  Financial Resource Strain: Low Risk  (01/26/2023)  Physical Activity: Inactive (01/26/2023)  Social Connections: Moderately Integrated (01/26/2023)  Stress: No Stress Concern Present (01/26/2023)  Tobacco Use: Low Risk  (01/26/2023)     USPSTF grade A and B recommendations - reviewed and addressed today  Depression:  Phq 9 completed today by patient, was reviewed by me with patient in the room PHQ score is neg, pt feels good    01/26/2023    9:36 AM 07/19/2022    9:08 AM 04/18/2022    9:49 AM 03/18/2022    8:53 AM  PHQ 2/9 Scores  PHQ - 2 Score 0 0 0 0   PHQ- 9 Score 0 0 0       01/26/2023    9:36 AM 07/19/2022    9:08 AM 04/18/2022    9:49 AM 03/18/2022    8:53 AM 02/15/2022    2:08 PM  Depression screen PHQ 2/9  Decreased Interest 0 0 0 0 0  Down, Depressed, Hopeless 0 0 0 0 0  PHQ - 2 Score 0 0 0 0 0  Altered sleeping 0 0 0    Tired, decreased energy 0 0 0    Change in appetite 0 0 0    Feeling bad or failure about yourself  0 0 0    Trouble concentrating 0 0 0    Moving slowly or fidgety/restless 0 0 0    Suicidal thoughts 0 0 0    PHQ-9 Score 0 0 0    Difficult doing work/chores Not difficult at all Not difficult at all Not difficult at all      Alcohol screening: Flowsheet Row Office Visit from 01/26/2023 in [facility]  AUDIT-C Score 1       Immunizations and Health Maintenance: Health Maintenance  Topic Date Due   MAMMOGRAM  02/07/2018   COVID-19 Vaccine (1) 02/11/2023 (Originally 09/15/1966)   Zoster Vaccines- Shingrix (2 of 2) 04/27/2023 (Originally 06/13/2021)   COLONOSCOPY (Pts 45-29yrs Insurance coverage  will need to be confirmed)  07/20/2023 (Originally 04/27/2019)   INFLUENZA VACCINE  05/11/2023   DTaP/Tdap/Td (2 - Td or Tdap) 06/08/2031   Hepatitis C Screening  Completed   HIV Screening  Completed   HPV VACCINES  Aged Out   PAP SMEAR-Modifier  Discontinued     Hep C Screening:   STD testing and prevention (HIV/chl/gon/syphilis):  see above, no additional testing desired by pt today  Intimate partner violence:denies, safe  Sexual History/Pain during Intercourse: Married  Menstrual History/LMP/Abnormal Bleeding:  no AUB, no pain/irritation with sex PAP has not been done in a long time, however pt only had partial hysterectomy - not total - needs cervical ca screening No LMP recorded. Patient has had a hysterectomy.  Incontinence Symptoms: none denies  Breast cancer: due Last Mammogram: *see HM list above  Cervical cancer screening: overdue  - unfortunately discontinued  incorrectly Pt denies family hx of cancers - breast, ovarian, uterine, colon:     Osteoporosis:   Discussion on osteoporosis per age, including high calcium and vitamin D supplementation, weight bearing exercises  Skin cancer:  Hx of skin CA -  NO Discussed atypical lesions   Colorectal cancer:   Colonoscopy is due - referrals ordered Discussed concerning signs and sx of CRC, pt denies change in bowels, blood in stool  Lung cancer:   Low Dose CT Chest recommended if Age 51-80 years, 20 pack-year currently smoking OR have quit w/in 15years. Patient does not qualify.    Social History   Tobacco Use   Smoking status: Never   Smokeless tobacco: Never  Vaping Use   Vaping Use: Never used  Substance Use Topics   Alcohol use: Yes    Alcohol/week: 0.0 standard drinks of alcohol    Comment: occasional   Drug use: No     Flowsheet Row [facility] in [facility]  AUDIT-C Score 1       Family History  Problem Relation Age of Onset   Hypertension Mother    Parkinson's disease Mother    Hypertension Sister    Hypertension Paternal Aunt        pat great aunt   Cancer Father        colon or lung ?   Breast cancer Paternal Aunt    Hernia Brother    ADD / ADHD Son    Cancer Maternal Grandfather        lung     Blood pressure/Hypertension: BP Readings from Last 3 Encounters:  01/26/23 128/80  12/12/22 114/74  08/22/22 128/76    Weight/Obesity: Wt Readings from Last 3 Encounters:  01/26/23 213 lb 1.6 oz (96.7 kg)  12/12/22 211 lb 3.2 oz (95.8 kg)  08/22/22 221 lb 6.4 oz (100.4 kg)   BMI Readings from Last 3 Encounters:  01/26/23 36.87 kg/m  12/12/22 36.25 kg/m  08/22/22 37.42 kg/m     Lipids:  Lab Results  Component Value Date   CHOL 261 (H) 02/16/2022   CHOL 238 (H) 08/06/2019   CHOL 249 (H) 07/13/2018   Lab Results  Component Value Date   HDL 75 02/16/2022   HDL 71 08/06/2019   HDL 68 07/13/2018   Lab  Results  Component Value Date   LDLCALC 159 (H) 02/16/2022   LDLCALC 151 (H) 08/06/2019   LDLCALC 156 (H) 07/13/2018   Lab Results  Component Value Date   TRIG 153 (H) 02/16/2022   TRIG 95 08/06/2019   TRIG 127  07/13/2018   Lab Results  Component Value Date   CHOLHDL 3.5 02/16/2022   CHOLHDL 3.4 08/06/2019   CHOLHDL 3.7 07/13/2018   No results found for: "LDLDIRECT" Based on the results of lipid panel his/her cardiovascular risk factor ( using [facility] )  in the next 10 years is: The 10-year ASCVD risk score (Arnett DK, et al., 2019) is: 4.9%   Values used to calculate the score:     Age: 86 years     Sex: Female     Is Non-Hispanic African American: Yes     Diabetic: No     Tobacco smoker: No     Systolic Blood Pressure: 128 mmHg     Is BP treated: No     HDL Cholesterol: 75 mg/dL     Total Cholesterol: 261 mg/dL  Glucose:  Glucose  Date Value Ref Range Status  02/16/2022 83 70 - 99 mg/dL Final  11/91/4782 88 65 - 99 mg/dL Final  95/62/1308 83 65 - 99 mg/dL Final    Advanced Care Planning:  A voluntary discussion about advance care planning including the explanation and discussion of advance directives.   Discussed health care proxy and Living will, and the patient was able to identify a health care proxy as her husband.   Patient does not have a living will at present time.   Social History       Social History   Socioeconomic History   Marital status: Married    Spouse name: Jimmey Ralph   Number of children: 2   Years of education: 19   Highest education level: Master's degree (e.g., MA, MS, MEng, MEd, MSW, MBA)  Occupational History   Not on file  Tobacco Use   Smoking status: Never   Smokeless tobacco: Never  Vaping Use   Vaping Use: Never used  Substance and Sexual Activity   Alcohol use: Yes    Alcohol/week: 0.0 standard drinks of alcohol    Comment: occasional   Drug use: No   Sexual activity: Yes    Partners: Male  Other Topics Concern    Not on file  Social History Narrative   Not on file   Social Determinants of Health   Financial Resource Strain: Low Risk  (01/26/2023)   Overall Financial Resource Strain (CARDIA)    Difficulty of Paying Living Expenses: Not hard at all  Food Insecurity: No Food Insecurity (01/26/2023)   Hunger Vital Sign    Worried About Running Out of Food in the Last Year: Never true    Ran Out of Food in the Last Year: Never true  Transportation Needs: No Transportation Needs (06/07/2021)   PRAPARE - Administrator, Civil Service (Medical): No    Lack of Transportation (Non-Medical): No  Physical Activity: Inactive (01/26/2023)   Exercise Vital Sign    Days of Exercise per Week: 0 days    Minutes of Exercise per Session: 0 min  Stress: No Stress Concern Present (01/26/2023)   [employer] of Occupational Health - Occupational Stress Questionnaire    Feeling of Stress : Not at all  Social Connections: Moderately Integrated (01/26/2023)   Social Connection and Isolation Panel [NHANES]    Frequency of Communication with Friends and Family: More than three times a week    Frequency of Social Gatherings with Friends and Family: More than three times a week    Attends Religious Services: More than 4 times per year    Active Member of [employer]  or Organizations: No    Attends Banker Meetings: Never    Marital Status: Married    Family History        Family History  Problem Relation Age of Onset   Hypertension Mother    Parkinson's disease Mother    Hypertension Sister    Hypertension Paternal Aunt        pat great aunt   Cancer Father        colon or lung ?   Breast cancer Paternal Aunt    Hernia Brother    ADD / ADHD Son    Cancer Maternal Grandfather        lung    Patient Active Problem List   Diagnosis Date Noted   Obesity (BMI 30-39.9) 08/26/2022   Class 2 obesity with body mass index (BMI) of 38.0 to 38.9 in adult 07/21/2022   Unintended weight gain  02/17/2022   OSA on CPAP 02/17/2022   Bilateral lower extremity edema 02/17/2022   Metabolic syndrome 02/17/2022   Hyperlipidemia 02/17/2022   Prediabetes 02/17/2022   Morbid obesity with BMI of 40.0-44.9, adult 03/02/2017   Seborrheic keratoses 03/02/2017    Past Surgical History:  Procedure Laterality Date   ABDOMINAL HYSTERECTOMY  2006   Partial   COLONOSCOPY WITH PROPOFOL N/A 04/26/2016   Procedure: COLONOSCOPY WITH PROPOFOL;  Surgeon: Earline Mayotte, MD;  Location: [facility] ENDOSCOPY;  Service: Endoscopy;  Laterality: N/A;     Current Outpatient Medications:    MULTIPLE VITAMINS PO, Take by mouth., Disp: , Rfl:    [START ON 02/28/2023] Semaglutide-Weight Management 2.4 MG/0.75ML SOAJ, Inject 2.4 mg into the skin once a week., Disp: 9 mL, Rfl: 0  No Known Allergies  Patient Care Team: Danelle Berry, PA-C as PCP - General (Family Medicine) Dennison Mascot, MD (Family Medicine) Lemar Livings, Merrily Pew, MD (General Surgery)   Chart Review: I personally reviewed active problem list, medication list, allergies, family history, social history, health maintenance, notes from last encounter, lab results, imaging with the patient/caregiver today.   Review of Systems  Constitutional: Negative.   HENT: Negative.    Eyes: Negative.   Respiratory: Negative.    Cardiovascular: Negative.   Gastrointestinal: Negative.   Endocrine: Negative.   Genitourinary: Negative.   Musculoskeletal: Negative.   Skin: Negative.   Allergic/Immunologic: Negative.   Neurological: Negative.   Hematological: Negative.   Psychiatric/Behavioral: Negative.    All other systems reviewed and are negative.         Objective:   Vitals:  Vitals:   01/26/23 0953  BP: 128/80  Pulse: 73  Resp: 16  Temp: 97.7 F (36.5 C)  TempSrc: Oral  SpO2: 99%  Weight: 213 lb 1.6 oz (96.7 kg)  Height: 5' 3.75" (1.619 m)    Body mass index is 36.87 kg/m.  Physical Exam Vitals and nursing note reviewed.   Constitutional:      General: She is not in acute distress.    Appearance: Normal appearance. She is well-developed. She is not ill-appearing, toxic-appearing or diaphoretic.  HENT:     Head: Normocephalic and atraumatic.     Right Ear: External ear normal.     Left Ear: External ear normal.     Nose: Nose normal.     Mouth/Throat:     Mouth: Mucous membranes are moist.     Pharynx: Oropharynx is clear. Uvula midline. No oropharyngeal exudate or posterior oropharyngeal erythema.  Eyes:     General: Lids are normal. No scleral  icterus.       Right eye: No discharge.        Left eye: No discharge.     Conjunctiva/sclera: Conjunctivae normal.  Neck:     Trachea: Phonation normal. No tracheal deviation.  Cardiovascular:     Rate and Rhythm: Normal rate and regular rhythm.     Pulses: Normal pulses.          Radial pulses are 2+ on the right side and 2+ on the left side.       Posterior tibial pulses are 2+ on the right side and 2+ on the left side.     Heart sounds: Normal heart sounds. No murmur heard.    No friction rub. No gallop.  Pulmonary:     Effort: Pulmonary effort is normal. No respiratory distress.     Breath sounds: Normal breath sounds. No stridor. No wheezing, rhonchi or rales.  Chest:     Chest wall: No tenderness.  Abdominal:     General: Bowel sounds are normal. There is no distension.     Palpations: Abdomen is soft.     Tenderness: There is no abdominal tenderness. There is no guarding or rebound.  Musculoskeletal:        General: No deformity.     Cervical back: Normal range of motion and neck supple.     Right lower leg: No edema.     Left lower leg: No edema.  Lymphadenopathy:     Cervical: No cervical adenopathy.  Skin:    General: Skin is warm and dry.     Capillary Refill: Capillary refill takes less than 2 seconds.     Coloration: Skin is not pale.     Findings: No rash.  Neurological:     Mental Status: She is alert and oriented to person,  place, and time.     Motor: No abnormal muscle tone.     Gait: Gait normal.  Psychiatric:        Mood and Affect: Mood normal.        Speech: Speech normal.        Behavior: Behavior normal.       Fall Risk:    01/26/2023    9:36 AM 07/19/2022    9:08 AM 04/18/2022    9:48 AM 03/18/2022    8:52 AM 02/15/2022    2:08 PM  Fall Risk   Falls in the past year? 0 0 0 0 0  Number falls in past yr: 0 0 0 0   Injury with Fall? 0 0 0 0   Risk for fall due to : No Fall Risks No Fall Risks No Fall Risks No Fall Risks No Fall Risks  Follow up Falls prevention discussed;Education provided;Falls evaluation completed Falls prevention discussed;Education provided Falls prevention discussed;Education provided Falls prevention discussed Falls prevention discussed    Functional Status Survey: Is the patient deaf or have difficulty hearing?: No Does the patient have difficulty seeing, even when wearing glasses/contacts?: No Does the patient have difficulty concentrating, remembering, or making decisions?: No Does the patient have difficulty walking or climbing stairs?: No Does the patient have difficulty dressing or bathing?: No Does the patient have difficulty doing errands alone such as visiting a doctor's office or shopping?: No   Assessment & Plan:    CPE completed today  USPSTF grade A and B recommendations reviewed with patient; age-appropriate recommendations, preventive care, screening tests, etc discussed and encouraged; healthy living encouraged; see AVS for patient education  given to patient  Discussed importance of 150 minutes of physical activity weekly, AHA exercise recommendations given to pt in AVS/handout  Discussed importance of healthy diet:  eating lean meats and proteins, avoiding trans fats and saturated fats, avoid simple sugars and excessive carbs in diet, eat 6 servings of fruit/vegetables daily and drink plenty of water and avoid sweet beverages.    Recommended pt to do  annual eye exam and routine dental exams/cleanings  Depression, alcohol, fall screening completed as documented above and per flowsheets  Advance Care planning information and packet discussed and offered today, encouraged pt to discuss with family members/spouse/partner/friends and complete Advanced directive packet and bring copy to office   Reviewed Health Maintenance: Health Maintenance  Topic Date Due   MAMMOGRAM  02/07/2018   COVID-19 Vaccine (1) 02/11/2023 (Originally 09/15/1966)   Zoster Vaccines- Shingrix (2 of 2) 04/27/2023 (Originally 06/13/2021)   COLONOSCOPY (Pts 45-82yrs Insurance coverage will need to be confirmed)  07/20/2023 (Originally 04/27/2019)   INFLUENZA VACCINE  05/11/2023   DTaP/Tdap/Td (2 - Td or Tdap) 06/08/2031   Hepatitis C Screening  Completed   HIV Screening  Completed   HPV VACCINES  Aged Out   PAP SMEAR-Modifier  Discontinued    Immunizations: Immunization History  Administered Date(s) Administered   Influenza,inj,Quad PF,6+ Mos 08/27/2015, 07/13/2018   Tdap 06/07/2021   Zoster Recombinat (Shingrix) 04/18/2021   Vaccines:  HPV: up to at age 38 , ask insurance if age between 46-45  Shingrix: 58-64 yo and ask insurance if covered when patient above 44 yo Pneumonia:  educated and discussed with patient. Flu:  educated and discussed with patient. COVID:      ICD-10-CM   1. Annual physical exam  Z00.00 Hemoglobin A1c    Lipid panel    CBC with Differential/Platelet    Comprehensive metabolic panel    TSH    T4, free    2. Prediabetes  R73.03 Hemoglobin A1c    Comprehensive metabolic panel    3. Class 2 obesity with body mass index (BMI) of 38.0 to 38.9 in adult, unspecified obesity type, unspecified whether serious comorbidity present  E66.9 Hemoglobin A1c   Z68.38 Lipid panel    CBC with Differential/Platelet    Comprehensive metabolic panel    Semaglutide-Weight Management 2.4 MG/0.75ML SOAJ    4. Encounter for screening mammogram for  malignant neoplasm of breast  Z12.31 MM 3D SCREENING MAMMOGRAM BILATERAL BREAST    5. Screening for colon cancer  Z12.11 Ambulatory referral to Gastroenterology    6. Postmenopausal estrogen deficiency  Z78.0 Comprehensive metabolic panel    DG Bone Density     Pt will need to do a pap/HPV for cervical cancer screening - we reviewed the chart thoroughly to find out surgical hx, partial hysterectomy documented in surgical hx, but past surgery info or pathology not in chart - the PAP was discontinued in error at some point in the past - pt wishes to do it with a subsequent OV  HM updated     Danelle Berry, PA-C 01/26/23 10:03 AM  [facility]

## 2023-01-27 ENCOUNTER — Encounter: Payer: Self-pay | Admitting: Family Medicine

## 2023-01-30 ENCOUNTER — Other Ambulatory Visit: Payer: Self-pay

## 2023-01-30 ENCOUNTER — Telehealth: Payer: Self-pay

## 2023-01-30 DIAGNOSIS — Z860100 Personal history of colon polyps, unspecified: Secondary | ICD-10-CM

## 2023-01-30 MED ORDER — NA SULFATE-K SULFATE-MG SULF 17.5-3.13-1.6 GM/177ML PO SOLN: 1.0000 | Freq: Once | ORAL | 0 refills | Status: AC

## 2023-01-30 NOTE — Telephone Encounter (Signed)
Gastroenterology Pre-Procedure Review  Request Date: 02/20/23 Requesting Physician: Dr. Servando Snare  PATIENT REVIEW QUESTIONS: The patient responded to the following health history questions as indicated:    1. Are you having any GI issues? no 2. Do you have a personal history of Polyps? yes (Last colonoscopy performed by Dr. Lemar Livings 04/26/16 10mm polyp noted.) 3. Do you have a family history of Colon Cancer or Polyps?  Unsure father may have died from lung or colon cancer  4. Diabetes Mellitus? no 5. Joint replacements in the past 12 months?no 6. Major health problems in the past 3 months?no 7. Any artificial heart valves, MVP, or defibrillator?no    MEDICATIONS & ALLERGIES:    Patient reports the following regarding taking any anticoagulation/antiplatelet therapy:   Plavix, Coumadin, Eliquis, Xarelto, Lovenox, Pradaxa, Brilinta, or Effient? no Aspirin? no  Patient confirms/reports the following medications:  Current Outpatient Medications  Medication Sig Dispense Refill   MULTIPLE VITAMINS PO Take by mouth.     [START ON 02/28/2023] Semaglutide-Weight Management 2.4 MG/0.75ML SOAJ Inject 2.4 mg into the skin once a week. 9 mL 3   No current facility-administered medications for this visit.    Patient confirms/reports the following allergies:  No Known Allergies  No orders of the defined types were placed in this encounter.   AUTHORIZATION INFORMATION Primary Insurance: 1D#: Group #:  Secondary Insurance: 1D#: Group #:  SCHEDULE INFORMATION: Date: 02/20/23 Time: Location: [facility]

## 2023-02-14 ENCOUNTER — Encounter: Payer: Self-pay | Admitting: Gastroenterology

## 2023-02-14 NOTE — Anesthesia Preprocedure Evaluation (Addendum)
Anesthesia Evaluation  Patient identified by MRN, date of birth, ID band Patient awake    Reviewed: Allergy & Precautions, H&P , NPO status , Patient's Chart, lab work & pertinent test results  Airway Mallampati: II  TM Distance: >3 FB Neck ROM: Full    Dental no notable dental hx.    Pulmonary neg pulmonary ROS, sleep apnea and Continuous Positive Airway Pressure Ventilation    Pulmonary exam normal breath sounds clear to auscultation       Cardiovascular + Past MI  negative cardio ROS Normal cardiovascular exam Rhythm:Regular Rate:Normal  MI per Epic but also note on Epic that patient denies Epic, no echo found   Neuro/Psych negative neurological ROS  negative psych ROS   GI/Hepatic negative GI ROS, Neg liver ROS,,,  Endo/Other  negative endocrine ROS    Renal/GU negative Renal ROS  negative genitourinary   Musculoskeletal negative musculoskeletal ROS (+)    Abdominal   Peds negative pediatric ROS (+)  Hematology negative hematology ROS (+)   Anesthesia Other Findings Allergy  Sleep apnea on CPAP Bilateral leg edema  Obesity (BMI 35.0-39.9 without comorbidity) Lymphedema of both lower extremities Myocardial infarction (HCC)    Reproductive/Obstetrics negative OB ROS                              Anesthesia Physical Anesthesia Plan  ASA: 3  Anesthesia Plan: General   Post-op Pain Management:    Induction: Intravenous  PONV Risk Score and Plan:   Airway Management Planned: Natural Airway and Nasal Cannula  Additional Equipment:   Intra-op Plan:   Post-operative Plan:   Informed Consent: I have reviewed the patients History and Physical, chart, labs and discussed the procedure including the risks, benefits and alternatives for the proposed anesthesia with the patient or authorized representative who has indicated his/her understanding and acceptance.     Dental  Advisory Given  Plan Discussed with: Anesthesiologist, CRNA and Surgeon  Anesthesia Plan Comments: (Patient consented for risks of anesthesia including but not limited to:  - adverse reactions to medications - risk of airway placement if required - damage to eyes, teeth, lips or other oral mucosa - nerve damage due to positioning  - sore throat or hoarseness - Damage to heart, brain, nerves, lungs, other parts of body or loss of life  Patient voiced understanding.)         Anesthesia Quick Evaluation

## 2023-02-20 ENCOUNTER — Other Ambulatory Visit: Payer: Self-pay

## 2023-02-20 ENCOUNTER — Encounter
Admission: RE | Disposition: A | Discharge status: Home / Self Care | Payer: Self-pay | Source: Home / Self Care | Attending: Gastroenterology

## 2023-02-20 ENCOUNTER — Ambulatory Visit
Admission: RE | Admit: 2023-02-20 | Discharge: 2023-02-20 | Disposition: A | Discharge status: Home / Self Care | Payer: Managed Care, Other (non HMO) | Attending: Gastroenterology | Admitting: Gastroenterology

## 2023-02-20 ENCOUNTER — Ambulatory Visit: Payer: Managed Care, Other (non HMO) | Admitting: Anesthesiology

## 2023-02-20 DIAGNOSIS — K635 Polyp of colon: Secondary | ICD-10-CM | POA: Diagnosis not present

## 2023-02-20 DIAGNOSIS — G4730 Sleep apnea, unspecified: Secondary | ICD-10-CM | POA: Diagnosis not present

## 2023-02-20 DIAGNOSIS — Z860100 Personal history of colon polyps, unspecified: Secondary | ICD-10-CM

## 2023-02-20 DIAGNOSIS — E669 Obesity, unspecified: Secondary | ICD-10-CM | POA: Diagnosis not present

## 2023-02-20 DIAGNOSIS — Z1211 Encounter for screening for malignant neoplasm of colon: Secondary | ICD-10-CM | POA: Diagnosis present

## 2023-02-20 DIAGNOSIS — K5730 Diverticulosis of large intestine without perforation or abscess without bleeding: Secondary | ICD-10-CM | POA: Diagnosis not present

## 2023-02-20 DIAGNOSIS — I252 Old myocardial infarction: Secondary | ICD-10-CM | POA: Diagnosis not present

## 2023-02-20 DIAGNOSIS — K640 First degree hemorrhoids: Secondary | ICD-10-CM | POA: Diagnosis not present

## 2023-02-20 DIAGNOSIS — Z6839 Body mass index (BMI) 39.0-39.9, adult: Secondary | ICD-10-CM | POA: Diagnosis not present

## 2023-02-20 HISTORY — DX: Acute myocardial infarction, unspecified: I21.9

## 2023-02-20 HISTORY — PX: COLONOSCOPY WITH PROPOFOL: SHX5780

## 2023-02-20 HISTORY — PX: POLYPECTOMY: SHX5525

## 2023-02-20 HISTORY — DX: Motion sickness, initial encounter: T75.3XXA

## 2023-02-20 SURGERY — COLONOSCOPY WITH PROPOFOL
Anesthesia: General | Site: Rectum

## 2023-02-20 MED ORDER — LIDOCAINE HCL (CARDIAC) PF 100 MG/5ML IV SOSY
PREFILLED_SYRINGE | INTRAVENOUS | Status: DC | PRN
  Administered 2023-02-20: 60 mg via INTRAVENOUS

## 2023-02-20 MED ORDER — STERILE WATER FOR IRRIGATION IR SOLN
Status: DC | PRN
  Administered 2023-02-20: 100 mL

## 2023-02-20 MED ORDER — SODIUM CHLORIDE 0.9 % IV SOLN: INTRAVENOUS | Status: DC

## 2023-02-20 MED ORDER — LACTATED RINGERS IV SOLN: INTRAVENOUS | Status: DC

## 2023-02-20 MED ORDER — PROPOFOL 10 MG/ML IV BOLUS
INTRAVENOUS | Status: DC | PRN
  Administered 2023-02-20: 20 mg via INTRAVENOUS
  Administered 2023-02-20: 30 mg via INTRAVENOUS
  Administered 2023-02-20: 50 mg via INTRAVENOUS
  Administered 2023-02-20: 100 mg via INTRAVENOUS

## 2023-02-20 SURGICAL SUPPLY — 7 items
FORCEPS BIOP RAD 4 LRG CAP 4 (CUTTING FORCEPS) IMPLANT
GOWN CVR UNV OPN BCK APRN NK (MISCELLANEOUS) ×4 IMPLANT
GOWN ISOL THUMB LOOP REG UNIV (MISCELLANEOUS) ×4
KIT PRC NS LF DISP ENDO (KITS) ×2 IMPLANT
KIT PROCEDURE OLYMPUS (KITS) ×2
MANIFOLD NEPTUNE II (INSTRUMENTS) ×2 IMPLANT
WATER STERILE IRR 250ML POUR (IV SOLUTION) ×2 IMPLANT

## 2023-02-20 NOTE — Op Note (Signed)
[facility] Gastroenterology Patient Name: Sierra Buck Procedure Date: 02/20/2023 10:12 AM MRN: [id number] Account #: [id number] Date of Birth: [birth year] Admit Type: Outpatient Age: 62 Room: [facility] OR ROOM 01 Gender: Female Note Status: Finalized Instrument Name: [id number] Procedure:             Colonoscopy Indications:           High risk colon cancer surveillance: Personal history                         of colonic polyps Providers:             Midge Minium MD, MD Referring MD:          Danelle Berry (Referring MD) Medicines:             Propofol per Anesthesia Complications:         No immediate complications. Procedure:             Pre-Anesthesia Assessment:                        - Prior to the procedure, a History and Physical was                         performed, and patient medications and allergies were                         reviewed. The patient's tolerance of previous                         anesthesia was also reviewed. The risks and benefits                         of the procedure and the sedation options and risks                         were discussed with the patient. All questions were                         answered, and informed consent was obtained. Prior                         Anticoagulants: The patient has taken no anticoagulant                         or antiplatelet agents. ASA Grade Assessment: II - A                         patient with mild systemic disease. After reviewing                         the risks and benefits, the patient was deemed in                         satisfactory condition to undergo the procedure.                        After obtaining informed consent, the colonoscope was  passed under direct vision. Throughout the procedure,                         the patient's blood pressure, pulse, and oxygen                         saturations were monitored continuously. The                          Colonoscope was introduced through the anus and                         advanced to the the cecum, identified by appendiceal                         orifice and ileocecal valve. The colonoscopy was                         performed without difficulty. The patient tolerated                         the procedure well. The quality of the bowel                         preparation was excellent. Findings:      The perianal and digital rectal examinations were normal.      A 3 mm polyp was found in the descending colon. The polyp was sessile.       The polyp was removed with a cold biopsy forceps. Resection and       retrieval were complete.      A few small-mouthed diverticula were found in the sigmoid colon.      Non-bleeding internal hemorrhoids were found during retroflexion. The       hemorrhoids were Grade I (internal hemorrhoids that do not prolapse). Impression:            - One 3 mm polyp in the descending colon, removed with                         a cold biopsy forceps. Resected and retrieved.                        - Diverticulosis in the sigmoid colon.                        - Non-bleeding internal hemorrhoids. Recommendation:        - Discharge patient to home.                        - Resume previous diet.                        - Continue present medications.                        - Await pathology results.                        - Repeat colonoscopy in 7 years for surveillance. Procedure Code(s):     --- Professional ---  09811, Colonoscopy, flexible; with biopsy, single or                         multiple Diagnosis Code(s):     --- Professional ---                        Z86.010, Personal history of colonic polyps                        D12.4, Benign neoplasm of descending colon CPT copyright 2022 American Medical Association. All rights reserved. The codes documented in this report are preliminary and upon coder review may  be revised to meet current  compliance requirements. Midge Minium MD, MD 02/20/2023 10:37:16 AM This report has been signed electronically. Number of Addenda: 0 Note Initiated On: 02/20/2023 10:12 AM Scope Withdrawal Time: 0 hours 9 minutes 0 seconds  Total Procedure Duration: 0 hours 12 minutes 54 seconds  Estimated Blood Loss:  Estimated blood loss: none.      [facility]

## 2023-02-20 NOTE — Anesthesia Postprocedure Evaluation (Signed)
Anesthesia Post Note  Patient: Sierra Buck  Procedure(s) Performed: COLONOSCOPY WITH BIOPSY (Rectum) POLYPECTOMY (Rectum)  Patient location during evaluation: PACU Anesthesia Type: General Level of consciousness: awake and alert Pain management: pain level controlled Vital Signs Assessment: post-procedure vital signs reviewed and stable Respiratory status: spontaneous breathing, nonlabored ventilation, respiratory function stable and patient connected to nasal cannula oxygen Cardiovascular status: blood pressure returned to baseline and stable Postop Assessment: no apparent nausea or vomiting Anesthetic complications: no   No notable events documented.   Last Vitals:  Vitals:   02/20/23 1040 02/20/23 1045  BP:  118/82  Pulse: 69 67  Resp: 18 17  Temp:    SpO2: 99% 99%    Last Pain:  Vitals:   02/20/23 1045  TempSrc:   PainSc: 0-No pain                 Aleck Locklin C Valen Mascaro

## 2023-02-20 NOTE — H&P (Addendum)
Midge Minium, MD [facility] [address] [place], [place] [address] Phone:[phone] Fax : [phone]  Primary Care Physician:  Danelle Berry, PA-C Primary Gastroenterologist:  Dr. Servando Snare  Pre-Procedure History & Physical: HPI:  Sierra Buck is a 62 y.o. female is here for an colonoscopy.   Past Medical History:  Diagnosis Date   Allergy    Bilateral leg edema    Lymphedema of both lower extremities    Motion sickness    if reading in car   Myocardial infarction [facility])    pt denies   Obesity (BMI 35.0-39.9 without comorbidity) 03/02/2017   Sleep apnea    CPAP    Past Surgical History:  Procedure Laterality Date   ABDOMINAL HYSTERECTOMY  2006   Partial   COLONOSCOPY WITH PROPOFOL N/A 04/26/2016   Procedure: COLONOSCOPY WITH PROPOFOL;  Surgeon: Earline Mayotte, MD;  Location: [facility] ENDOSCOPY;  Service: Endoscopy;  Laterality: N/A;    Prior to Admission medications   Medication Sig Start Date End Date Taking? Authorizing Provider  MULTIPLE VITAMINS PO Take by mouth.   Yes [provider]  OVER THE COUNTER MEDICATION Nutrafol   Yes [provider]  Semaglutide-Weight Management 2.4 MG/0.75ML SOAJ Inject 2.4 mg into the skin once a week. 02/28/23  Yes Danelle Berry, PA-C  TURMERIC PO Take by mouth daily.   Yes [provider]    Allergies as of 01/30/2023   (No Known Allergies)    Family History  Problem Relation Age of Onset   Hypertension Mother    Parkinson's disease Mother    Hypertension Sister    Hypertension Paternal Aunt        pat great aunt   Cancer Father        colon or lung ?   Breast cancer Paternal Aunt    Hernia Brother    ADD / ADHD Son    Cancer Maternal Grandfather        lung    Social History   Socioeconomic History   Marital status: Married    Spouse name: Jimmey Ralph   Number of children: 2   Years of education: 19   Highest education level: Master's degree (e.g., MA, MS, MEng, MEd, MSW, MBA)   Occupational History   Not on file  Tobacco Use   Smoking status: Never   Smokeless tobacco: Never  Vaping Use   Vaping Use: Never used  Substance and Sexual Activity   Alcohol use: Yes    Alcohol/week: 0.0 standard drinks of alcohol    Comment: occasional   Drug use: No   Sexual activity: Yes    Partners: Male  Other Topics Concern   Not on file  Social History Narrative   Not on file   Social Determinants of Health   Financial Resource Strain: Low Risk  (01/26/2023)   Overall Financial Resource Strain (CARDIA)    Difficulty of Paying Living Expenses: Not hard at all  Food Insecurity: No Food Insecurity (01/26/2023)   Hunger Vital Sign    Worried About Running Out of Food in the Last Year: Never true    Ran Out of Food in the Last Year: Never true  Transportation Needs: No Transportation Needs (06/07/2021)   PRAPARE - Administrator, Civil Service (Medical): No    Lack of Transportation (Non-Medical): No  Physical Activity: Inactive (01/26/2023)   Exercise Vital Sign    Days of Exercise per Week: 0 days    Minutes of  Exercise per Session: 0 min  Stress: No Stress Concern Present (01/26/2023)   [employer] of Occupational Health - Occupational Stress Questionnaire    Feeling of Stress : Not at all  Social Connections: Moderately Integrated (01/26/2023)   Social Connection and Isolation Panel [NHANES]    Frequency of Communication with Friends and Family: More than three times a week    Frequency of Social Gatherings with Friends and Family: More than three times a week    Attends Religious Services: More than 4 times per year    Active Member of [employer] or Organizations: No    Attends Banker Meetings: Never    Marital Status: Married  Catering manager Violence: Not At Risk (01/26/2023)   Humiliation, Afraid, Rape, and Kick questionnaire    Fear of Current or Ex-Partner: No    Emotionally Abused: No    Physically Abused: No    Sexually  Abused: No    Review of Systems: See HPI, otherwise negative ROS  Physical Exam: Ht 5' 3.75" (1.619 m)   Wt 96.6 kg   BMI 36.85 kg/m  General:   Alert,  pleasant and cooperative in NAD Head:  Normocephalic and atraumatic. Neck:  Supple; no masses or thyromegaly. Lungs:  Clear throughout to auscultation.    Heart:  Regular rate and rhythm. Abdomen:  Soft, nontender and nondistended. Normal bowel sounds, without guarding, and without rebound.   Neurologic:  Alert and  oriented x4;  grossly normal neurologically.  Impression/Plan: Sierra Buck is here for an colonoscopy to be performed for a history of adenomatous polyps on 2017  Risks, benefits, limitations, and alternatives regarding  colonoscopy have been reviewed with the patient.  Questions have been answered.  All parties agreeable.   Midge Minium, MD  02/20/2023, 9:41 AM

## 2023-02-20 NOTE — Transfer of Care (Signed)
Immediate Anesthesia Transfer of Care Note  Patient: Sierra Buck  Procedure(s) Performed: COLONOSCOPY WITH BIOPSY (Rectum) POLYPECTOMY (Rectum)  Patient Location: PACU  Anesthesia Type: General  Level of Consciousness: awake, alert  and patient cooperative  Airway and Oxygen Therapy: Patient Spontanous Breathing and Patient connected to supplemental oxygen  Post-op Assessment: Post-op Vital signs reviewed, Patient's Cardiovascular Status Stable, Respiratory Function Stable, Patent Airway and No signs of Nausea or vomiting  Post-op Vital Signs: Reviewed and stable  Complications: No notable events documented.

## 2023-02-21 ENCOUNTER — Encounter: Payer: Self-pay | Admitting: Gastroenterology

## 2023-03-10 ENCOUNTER — Telehealth: Payer: Self-pay | Admitting: Family Medicine

## 2023-03-10 DIAGNOSIS — E669 Obesity, unspecified: Secondary | ICD-10-CM

## 2023-03-10 NOTE — Telephone Encounter (Signed)
Appt sch'd with Leisa for Monday

## 2023-03-10 NOTE — Telephone Encounter (Signed)
Patient would like to change medication from  Requested Prescriptions   Pending Prescriptions Disp Refills   Semaglutide-Weight Management 2.4 MG/0.75ML SOAJ 9 mL 3    Sig: Inject 2.4 mg into the skin once a week.   To Zepbound, please advise.

## 2023-03-10 NOTE — Telephone Encounter (Signed)
Patient would like to be switched from the [facility] to Zepbound. Patient would like medication sent to [id number], [place] - [address] [place], [place] [address] Phone: [phone]  Fax: [phone]

## 2023-03-13 ENCOUNTER — Encounter: Payer: Self-pay | Admitting: Family Medicine

## 2023-03-13 ENCOUNTER — Ambulatory Visit (INDEPENDENT_AMBULATORY_CARE_PROVIDER_SITE_OTHER): Payer: Managed Care, Other (non HMO) | Admitting: Family Medicine

## 2023-03-13 VITALS — BP 126/88 | HR 80 | Temp 97.8°F | Resp 16 | Ht 63.75 in | Wt 221.3 lb

## 2023-03-13 DIAGNOSIS — Z7689 Persons encountering health services in other specified circumstances: Secondary | ICD-10-CM | POA: Diagnosis not present

## 2023-03-13 DIAGNOSIS — E8881 Metabolic syndrome: Secondary | ICD-10-CM

## 2023-03-13 DIAGNOSIS — Z6838 Body mass index (BMI) 38.0-38.9, adult: Secondary | ICD-10-CM

## 2023-03-13 DIAGNOSIS — E669 Obesity, unspecified: Secondary | ICD-10-CM

## 2023-03-13 MED ORDER — TIRZEPATIDE-WEIGHT MANAGEMENT 12.5 MG/0.5ML ~~LOC~~ SOAJ
12.5000 mg | SUBCUTANEOUS | 0 refills | Status: AC
Start: 2023-04-10 — End: 2023-05-02

## 2023-03-13 MED ORDER — TIRZEPATIDE-WEIGHT MANAGEMENT 15 MG/0.5ML ~~LOC~~ SOAJ
15.0000 mg | SUBCUTANEOUS | 1 refills | Status: DC
Start: 2023-05-08 — End: 2023-07-24

## 2023-03-13 MED ORDER — TIRZEPATIDE-WEIGHT MANAGEMENT 10 MG/0.5ML ~~LOC~~ SOAJ
10.0000 mg | SUBCUTANEOUS | 0 refills | Status: AC
Start: 2023-03-13 — End: 2023-04-04

## 2023-03-13 NOTE — Progress Notes (Signed)
Patient ID: Sierra Buck, female    DOB: 1960-10-29, 62 y.o.   MRN: [id number]  PCP: Danelle Berry, PA-C  Chief Complaint  Patient presents with   Weight Loss    New med request zepbound already spoke to insurance    Subjective:   Sierra Buck is a 62 y.o. female, presents to clinic with CC of the following:  HPI   Has been on wegovy for weight management for about a year (started about May 2023) She's been off the med for about 2 weeks following the colonoscopy  On wegovy with diet/lifestyle efforts she has lost about 42 lbs, with recent weight increase up 8 lbs Wt Readings from Last 10 Encounters:  03/13/23 221 lb 4.8 oz (100.4 kg)  02/20/23 208 lb (94.3 kg)  01/26/23 213 lb 1.6 oz (96.7 kg)  12/12/22 211 lb 3.2 oz (95.8 kg)  08/22/22 221 lb 6.4 oz (100.4 kg)  07/19/22 225 lb (102.1 kg)  04/18/22 238 lb (108 kg)  03/18/22 241 lb (109.3 kg)  02/15/22 247 lb (112 kg)  06/07/21 245 lb 1.6 oz (111.2 kg)   BMI Readings from Last 5 Encounters:  03/13/23 38.28 kg/m  02/20/23 35.98 kg/m  01/26/23 36.87 kg/m  12/12/22 36.25 kg/m  08/22/22 37.42 kg/m   She joined weight watchers as a condition of coverage from [place]  Requests change to zepbound She tolerated wegoby with no concerning SE, and she had no prior contraindications       Patient Active Problem List   Diagnosis Date Noted   History of colonic polyps 02/20/2023   Polyp of descending colon 02/20/2023   Obesity (BMI 30-39.9) 08/26/2022   Class 2 obesity with body mass index (BMI) of 38.0 to 38.9 in adult 07/21/2022   Unintended weight gain 02/17/2022   OSA on CPAP 02/17/2022   Bilateral lower extremity edema 02/17/2022   Metabolic syndrome 02/17/2022   Hyperlipidemia 02/17/2022   Prediabetes 02/17/2022   Morbid obesity with BMI of 40.0-44.9, adult (HCC) 03/02/2017   Seborrheic keratoses 03/02/2017      Current Outpatient Medications:    MULTIPLE VITAMINS PO, Take by mouth., Disp: ,  Rfl:    OVER THE COUNTER MEDICATION, Nutrafol, Disp: , Rfl:    TURMERIC PO, Take by mouth daily., Disp: , Rfl:    Semaglutide-Weight Management 2.4 MG/0.75ML SOAJ, Inject 2.4 mg into the skin once a week. (Patient not taking: Reported on 03/13/2023), Disp: 9 mL, Rfl: 3   No Known Allergies   Social History   Tobacco Use   Smoking status: Never   Smokeless tobacco: Never  Vaping Use   Vaping Use: Never used  Substance Use Topics   Alcohol use: Yes    Alcohol/week: 0.0 standard drinks of alcohol    Comment: occasional   Drug use: No      Chart Review Today: I personally reviewed active problem list, medication list, allergies, family history, social history, health maintenance, notes from last encounter, lab results, imaging with the patient/caregiver today.   Review of Systems  Constitutional: Negative.   HENT: Negative.    Eyes: Negative.   Respiratory: Negative.    Cardiovascular: Negative.   Gastrointestinal: Negative.   Endocrine: Negative.   Genitourinary: Negative.   Musculoskeletal: Negative.   Skin: Negative.   Allergic/Immunologic: Negative.   Neurological: Negative.   Hematological: Negative.   Psychiatric/Behavioral: Negative.    All other systems reviewed and are negative.      Objective:   Vitals:  03/13/23 1123  BP: 126/88  Pulse: 80  Resp: 16  Temp: 97.8 F (36.6 C)  TempSrc: Oral  SpO2: 96%  Weight: 221 lb 4.8 oz (100.4 kg)  Height: 5' 3.75" (1.619 m)    Body mass index is 38.28 kg/m.  Physical Exam Vitals and nursing note reviewed.  Constitutional:      General: She is not in acute distress.    Appearance: Normal appearance. She is well-developed. She is obese. She is not ill-appearing, toxic-appearing or diaphoretic.  HENT:     Head: Normocephalic and atraumatic.     Nose: Nose normal.  Eyes:     General:        Right eye: No discharge.        Left eye: No discharge.     Conjunctiva/sclera: Conjunctivae normal.  Neck:      Trachea: No tracheal deviation.  Cardiovascular:     Rate and Rhythm: Normal rate and regular rhythm.  Pulmonary:     Effort: Pulmonary effort is normal. No respiratory distress.     Breath sounds: No stridor.  Musculoskeletal:        General: Normal range of motion.  Skin:    General: Skin is warm and dry.     Findings: No rash.  Neurological:     Mental Status: She is alert.     Motor: No abnormal muscle tone.     Coordination: Coordination normal.  Psychiatric:        Behavior: Behavior normal.      Results for orders placed or performed in visit on 02/15/22  CBC with Differential/Platelet  Result Value Ref Range   WBC 5.1 3.4 - 10.8 x10E3/uL   RBC 4.78 3.77 - 5.28 x10E6/uL   Hemoglobin 13.5 11.1 - 15.9 g/dL   Hematocrit 16.1 09.6 - 46.6 %   MCV 86 79 - 97 fL   MCH 28.2 26.6 - 33.0 pg   MCHC 32.7 31.5 - 35.7 g/dL   RDW 04.5 40.9 - 81.1 %   Platelets 289 150 - 450 x10E3/uL   Neutrophils 36 Not Estab. %   Lymphs 49 Not Estab. %   Monocytes 8 Not Estab. %   Eos 6 Not Estab. %   Basos 1 Not Estab. %   Neutrophils Absolute 1.8 1.4 - 7.0 x10E3/uL   Lymphocytes Absolute 2.5 0.7 - 3.1 x10E3/uL   Monocytes Absolute 0.4 0.1 - 0.9 x10E3/uL   EOS (ABSOLUTE) 0.3 0.0 - 0.4 x10E3/uL   Basophils Absolute 0.0 0.0 - 0.2 x10E3/uL   Immature Granulocytes 0 Not Estab. %   Immature Grans (Abs) 0.0 0.0 - 0.1 x10E3/uL  Lipid panel  Result Value Ref Range   Cholesterol, Total 261 (H) 100 - 199 mg/dL   Triglycerides 914 (H) 0 - 149 mg/dL   HDL 75 >78 mg/dL   VLDL Cholesterol Cal 27 5 - 40 mg/dL   LDL Chol Calc (NIH) 295 (H) 0 - 99 mg/dL   Chol/HDL Ratio 3.5 0.0 - 4.4 ratio  Hemoglobin A1c  Result Value Ref Range   Hgb A1c MFr Bld 6.0 (H) 4.8 - 5.6 %   Est. average glucose Bld gHb Est-mCnc 126 mg/dL  Comprehensive metabolic panel  Result Value Ref Range   Glucose 83 70 - 99 mg/dL   BUN 13 6 - 24 mg/dL   Creatinine, Ser 6.21 0.57 - 1.00 mg/dL   eGFR 98 >30 QM/VHQ/4.69    BUN/Creatinine Ratio 18 9 - 23   Sodium 140 134 -  144 mmol/L   Potassium 3.9 3.5 - 5.2 mmol/L   Chloride 101 96 - 106 mmol/L   CO2 24 20 - 29 mmol/L   Calcium 9.5 8.7 - 10.2 mg/dL   Total Protein 7.4 6.0 - 8.5 g/dL   Albumin 4.3 3.8 - 4.9 g/dL   Globulin, Total 3.1 1.5 - 4.5 g/dL   Albumin/Globulin Ratio 1.4 1.2 - 2.2   Bilirubin Total 0.3 0.0 - 1.2 mg/dL   Alkaline Phosphatase 109 44 - 121 IU/L   AST 18 0 - 40 IU/L   ALT 22 0 - 32 IU/L  TSH + free T4  Result Value Ref Range   TSH 2.430 0.450 - 4.500 uIU/mL   Free T4 1.07 0.82 - 1.77 ng/dL  Insulin, random  Result Value Ref Range   INSULIN 12.8 2.6 - 24.9 uIU/mL       Assessment & Plan:     ICD-10-CM   1. Class 2 obesity with body mass index (BMI) of 38.0 to 38.9 in adult, unspecified obesity type, unspecified whether serious comorbidity present  E66.9 tirzepatide (ZEPBOUND) 10 MG/0.5ML Pen   Z68.38 tirzepatide (ZEPBOUND) 12.5 MG/0.5ML Pen    tirzepatide (ZEPBOUND) 15 MG/0.5ML Pen    2. Encounter for weight management  Z76.89 tirzepatide (ZEPBOUND) 10 MG/0.5ML Pen    tirzepatide (ZEPBOUND) 12.5 MG/0.5ML Pen    tirzepatide (ZEPBOUND) 15 MG/0.5ML Pen    3. Metabolic syndrome  E88.810 tirzepatide (ZEPBOUND) 10 MG/0.5ML Pen    tirzepatide (ZEPBOUND) 12.5 MG/0.5ML Pen    tirzepatide (ZEPBOUND) 15 MG/0.5ML Pen     Pt has used wegovy in conjunction with diet/lifestyle efforts and successfully lost weight until recent plateau, she has checked with her insurance and they told her they do cover that bound for medical weight management and obesity as long as she is doing weight watchers program, doing as a adjunct to diet efforts particularly calorie reduction and increased physical exercise which patient is currently doing.. She has been on wegovy 2.4 mg dose except for the last 2 weeks because of her colonoscopy We will start her on 10 mg dose with monthly increase up to 15 which would be equivalent with her last wegovy dose The  requested drug is being used as an adjunct to lifestyle modification (e.g., dietary or caloric restriction, exercise, behavioral support, community based program). The patient has engaged in > 6months of lifestyle modification and failed to achieve desired weight loss. The patient has BMI greater than or equal to 30 kilograms / square meter with additional disorder related to weight (high cholesterol, hypertension, diabetes, or sleep apnea).    may need to do PA with her records and verification of weight watcher - pt has been working on this with her insurance  Will do a 4 month f/up on weight loss and SE/tolerance - pt encouraged to come in sooner if any problems or intolerance  Danelle Berry, PA-C 03/13/23 11:47 AM

## 2023-03-16 ENCOUNTER — Other Ambulatory Visit: Payer: Self-pay

## 2023-03-19 ENCOUNTER — Encounter: Payer: Self-pay | Admitting: Gastroenterology

## 2023-03-23 ENCOUNTER — Telehealth: Payer: Self-pay | Admitting: Family Medicine

## 2023-03-23 NOTE — Telephone Encounter (Signed)
Called pt no answer left vm to call back, I will also send MyChart message  Request Reference Number: [id number]. ZEPBOUND INJ 10/0.5ML is denied for not meeting the prior authorization requirement(s). Details of this decision are in the notice attached below or have been faxed to you.

## 2023-03-23 NOTE — Telephone Encounter (Signed)
Copied from CRM [id number]. Topic: General - Other >> Mar 23, 2023  4:13 PM Clide Dales wrote: Patient states that her insurance is requiring a PA for tirzepatide (ZEPBOUND) 10 MG/0.5ML Pen. Please advise.

## 2023-04-04 ENCOUNTER — Ambulatory Visit
Admission: RE | Admit: 2023-04-04 | Discharge: 2023-04-04 | Disposition: A | Discharge status: Home / Self Care | Payer: Managed Care, Other (non HMO) | Source: Ambulatory Visit | Attending: Family Medicine | Admitting: Family Medicine

## 2023-04-04 DIAGNOSIS — Z780 Asymptomatic menopausal state: Secondary | ICD-10-CM | POA: Diagnosis present

## 2023-04-04 DIAGNOSIS — Z1231 Encounter for screening mammogram for malignant neoplasm of breast: Secondary | ICD-10-CM | POA: Insufficient documentation

## 2023-04-06 ENCOUNTER — Other Ambulatory Visit: Payer: Self-pay | Admitting: Family Medicine

## 2023-04-06 DIAGNOSIS — N6489 Other specified disorders of breast: Secondary | ICD-10-CM

## 2023-04-06 DIAGNOSIS — R928 Other abnormal and inconclusive findings on diagnostic imaging of breast: Secondary | ICD-10-CM

## 2023-04-18 ENCOUNTER — Ambulatory Visit
Admission: RE | Admit: 2023-04-18 | Discharge: 2023-04-18 | Disposition: A | Discharge status: Home / Self Care | Payer: Managed Care, Other (non HMO) | Source: Ambulatory Visit | Attending: Family Medicine | Admitting: Family Medicine

## 2023-04-18 DIAGNOSIS — R928 Other abnormal and inconclusive findings on diagnostic imaging of breast: Secondary | ICD-10-CM | POA: Insufficient documentation

## 2023-04-18 DIAGNOSIS — N6489 Other specified disorders of breast: Secondary | ICD-10-CM

## 2023-04-19 ENCOUNTER — Other Ambulatory Visit: Payer: Self-pay

## 2023-04-19 ENCOUNTER — Telehealth: Payer: Self-pay | Admitting: Family Medicine

## 2023-04-19 DIAGNOSIS — E8881 Metabolic syndrome: Secondary | ICD-10-CM

## 2023-04-19 DIAGNOSIS — Z7689 Persons encountering health services in other specified circumstances: Secondary | ICD-10-CM

## 2023-04-19 DIAGNOSIS — E669 Obesity, unspecified: Secondary | ICD-10-CM

## 2023-04-19 MED ORDER — TIRZEPATIDE-WEIGHT MANAGEMENT 10 MG/0.5ML ~~LOC~~ SOAJ
10.0000 mg | SUBCUTANEOUS | 0 refills | Status: DC
Start: 2023-04-19 — End: 2023-07-24

## 2023-04-19 MED ORDER — TIRZEPATIDE 10 MG/0.5ML ~~LOC~~ SOAJ
10.0000 mg | SUBCUTANEOUS | 0 refills | Status: DC
Start: 2023-04-19 — End: 2023-04-19

## 2023-04-19 NOTE — Telephone Encounter (Signed)
New rx sent

## 2023-04-19 NOTE — Telephone Encounter (Signed)
Sierra Buck with [facility] is calling in because they received a prescription for Sierra Buck with diagnosis code for obesity. Sierra Buck says the prescription would need to be for Zepbound because Sierra Buck is used to treat type 2 diabetes. Please advise.

## 2023-07-14 ENCOUNTER — Ambulatory Visit: Payer: Managed Care, Other (non HMO) | Admitting: Family Medicine

## 2023-07-24 ENCOUNTER — Other Ambulatory Visit: Payer: Self-pay

## 2023-07-24 DIAGNOSIS — Z7689 Persons encountering health services in other specified circumstances: Secondary | ICD-10-CM

## 2023-07-24 DIAGNOSIS — E8881 Metabolic syndrome: Secondary | ICD-10-CM

## 2023-07-24 DIAGNOSIS — Z6838 Body mass index (BMI) 38.0-38.9, adult: Secondary | ICD-10-CM

## 2023-07-24 MED ORDER — TIRZEPATIDE-WEIGHT MANAGEMENT 10 MG/0.5ML ~~LOC~~ SOAJ: 10.0000 mg | SUBCUTANEOUS | 1 refills | Status: DC

## 2023-07-24 NOTE — Telephone Encounter (Signed)
I received a refill request form [facility] for zepbound. I called pt to ask which dose she has been doing and she states she has only done the 10mg  and wants to stay on that dose. She does not want to do the 15mg .

## 2023-08-02 ENCOUNTER — Ambulatory Visit: Payer: Managed Care, Other (non HMO) | Admitting: Family Medicine

## 2023-09-11 ENCOUNTER — Ambulatory Visit: Payer: Managed Care, Other (non HMO) | Admitting: Physician Assistant

## 2023-09-11 ENCOUNTER — Encounter: Payer: Self-pay | Admitting: Physician Assistant

## 2023-09-11 VITALS — BP 140/72 | HR 88 | Resp 16 | Ht 64.0 in | Wt 226.0 lb

## 2023-09-11 DIAGNOSIS — R7303 Prediabetes: Secondary | ICD-10-CM

## 2023-09-11 DIAGNOSIS — E66812 Obesity, class 2: Secondary | ICD-10-CM | POA: Diagnosis not present

## 2023-09-11 DIAGNOSIS — E8881 Metabolic syndrome: Secondary | ICD-10-CM

## 2023-09-11 DIAGNOSIS — Z7689 Persons encountering health services in other specified circumstances: Secondary | ICD-10-CM

## 2023-09-11 DIAGNOSIS — Z6838 Body mass index (BMI) 38.0-38.9, adult: Secondary | ICD-10-CM | POA: Diagnosis not present

## 2023-09-11 DIAGNOSIS — E782 Mixed hyperlipidemia: Secondary | ICD-10-CM

## 2023-09-11 MED ORDER — TIRZEPATIDE-WEIGHT MANAGEMENT 10 MG/0.5ML ~~LOC~~ SOAJ: 10.0000 mg | SUBCUTANEOUS | 0 refills | Status: DC

## 2023-09-11 NOTE — Assessment & Plan Note (Signed)
Chronic, ongoing Currently on Zepbound 10 mg weekly injection Recommend she continues with weight loss efforts and exercise to assist with this Refills provided Will recheck lipid and A1c  Results to dictate further management  Follow up in 6 months or sooner if concerns arise

## 2023-09-11 NOTE — Assessment & Plan Note (Addendum)
Chronic, ongoing Currently managed with Zepbound 10 mg weekly injection Refills provided today as patient wants to stay at the 10 mg dose for now- 3 month supply sent in for now Recommend she continues with exercise efforts and engages in strength exercises to prevent muscle loss  She denies side effects or concerns today with administration- reports she has been taking for about 4-6 weeks so far without issues Reviewed that she should not go without the medication longer than 3 consecutive weeks and resume at higher doses as more serious side effects are likely if this happens- she voiced understanding and agreement to contact office if she has supply issues.  Follow up in 3 months or sooner if concerns arise

## 2023-09-11 NOTE — Assessment & Plan Note (Signed)
Recheck A1c today Results to dictate further management

## 2023-09-11 NOTE — Progress Notes (Signed)
Established Patient Office Visit  Name: Sierra Buck   MRN: [id number]    DOB: 05-02-1961   Date:09/11/2023  Today's Provider: Jacquelin Hawking, MHS, PA-C Introduced myself to the patient as a PA-C and provided education on APPs in clinical practice.         Subjective  Chief Complaint  Chief Complaint  Patient presents with   Follow-up    6 month, medication refills    HPI   Weight management She was switched from [facility] to Zepbound in June 2024  She reports she had some issues with getting the medication initially. She states she has also had some administration issues  She has been taking Zepbound 10 mg weekly injection for about 6 weeks - delay in getting medication due to supply  She reports she has not been consistent with weekly dosing for the past few weeks- plans to get better at remembering to take each dose  She is no longer doing Weight Watchers actively  Exercise: She is walking about a mile and half every other day      Patient Active Problem List   Diagnosis Date Noted   History of colonic polyps 02/20/2023   Polyp of descending colon 02/20/2023   Obesity (BMI 30-39.9) 08/26/2022   Class 2 obesity with body mass index (BMI) of 38.0 to 38.9 in adult 07/21/2022   Unintended weight gain 02/17/2022   OSA on CPAP 02/17/2022   Bilateral lower extremity edema 02/17/2022   Metabolic syndrome 02/17/2022   Hyperlipidemia 02/17/2022   Prediabetes 02/17/2022   Morbid obesity with BMI of 40.0-44.9, adult (HCC) 03/02/2017   Seborrheic keratoses 03/02/2017    Past Surgical History:  Procedure Laterality Date   ABDOMINAL HYSTERECTOMY  2006   Partial   COLONOSCOPY WITH PROPOFOL N/A 04/26/2016   Procedure: COLONOSCOPY WITH PROPOFOL;  Surgeon: Earline Mayotte, MD;  Location: [facility] ENDOSCOPY;  Service: Endoscopy;  Laterality: N/A;   COLONOSCOPY WITH PROPOFOL N/A 02/20/2023   Procedure: COLONOSCOPY WITH BIOPSY;  Surgeon: Midge Minium, MD;  Location: [facility]ERY [facility];  Service: Endoscopy;  Laterality: N/A;  sleep apnea   POLYPECTOMY  02/20/2023   Procedure: POLYPECTOMY;  Surgeon: Midge Minium, MD;  Location: [facility];  Service: Endoscopy;;    Family History  Problem Relation Age of Onset   Hypertension Mother    Parkinson's disease Mother    Hypertension Sister    Hypertension Paternal Aunt        pat great aunt   Cancer Father        colon or lung ?   Breast cancer Paternal Aunt    Hernia Brother    ADD / ADHD Son    Cancer Maternal Grandfather        lung    Social History   Tobacco Use   Smoking status: Never   Smokeless tobacco: Never  Substance Use Topics   Alcohol use: Yes    Alcohol/week: 0.0 standard drinks of alcohol    Comment: occasional     Current Outpatient Medications:    MULTIPLE VITAMINS PO, Take by mouth., Disp: , Rfl:    OVER THE COUNTER MEDICATION, Nutrafol, Disp: , Rfl:    TURMERIC PO, Take by mouth daily., Disp: , Rfl:    tirzepatide (ZEPBOUND) 10 MG/0.5ML Pen, Inject 10 mg into the skin once a week., Disp: 6 mL, Rfl: 0  No Known Allergies  I personally reviewed active problem list, medication list,  allergies, health maintenance, notes from last encounter, lab results with the patient/caregiver today.   Review of Systems  Respiratory:  Negative for shortness of breath.   Cardiovascular:  Positive for leg swelling. Negative for chest pain and palpitations.  Gastrointestinal:  Positive for diarrhea (mild diarrhea since starting Zepbound). Negative for blood in stool, constipation, heartburn, nausea and vomiting.      Objective  Vitals:   09/11/23 0929  BP: (!) 140/72  Pulse: 88  Resp: 16  SpO2: 97%  Weight: 226 lb (102.5 kg)  Height: 5\' 4"  (1.626 m)    Body mass index is 38.79 kg/m.  Physical Exam Vitals reviewed.  Constitutional:      General: She is awake.     Appearance: Normal appearance. She is well-developed and well-groomed.  HENT:     Head: Normocephalic  and atraumatic.  Cardiovascular:     Rate and Rhythm: Normal rate and regular rhythm.     Pulses: Normal pulses.          Radial pulses are 2+ on the right side and 2+ on the left side.     Heart sounds: Normal heart sounds. No murmur heard.    No friction rub. No gallop.  Pulmonary:     Effort: Pulmonary effort is normal.     Breath sounds: Normal breath sounds. No decreased air movement. No decreased breath sounds, wheezing, rhonchi or rales.  Musculoskeletal:     Cervical back: Normal range of motion.     Right lower leg: No edema.     Left lower leg: No edema.  Skin:    General: Skin is warm and dry.  Neurological:     General: No focal deficit present.     Mental Status: She is alert and oriented to person, place, and time. Mental status is at baseline.     GCS: GCS eye subscore is 4. GCS verbal subscore is 5. GCS motor subscore is 6.  Psychiatric:        Attention and Perception: Attention and perception normal.        Mood and Affect: Mood and affect normal.        Speech: Speech normal.        Behavior: Behavior normal. Behavior is cooperative.        Thought Content: Thought content normal.        Cognition and Memory: Cognition normal.      No results found for this or any previous visit (from the past 2160 hour(s)).   PHQ2/9:    09/11/2023    9:28 AM 03/13/2023   11:22 AM 01/26/2023    9:36 AM 07/19/2022    9:08 AM 04/18/2022    9:49 AM  Depression screen PHQ 2/9  Decreased Interest 0 0 0 0 0  Down, Depressed, Hopeless 0 0 0 0 0  PHQ - 2 Score 0 0 0 0 0  Altered sleeping  0 0 0 0  Tired, decreased energy  0 0 0 0  Change in appetite  0 0 0 0  Feeling bad or failure about yourself   0 0 0 0  Trouble concentrating  0 0 0 0  Moving slowly or fidgety/restless  0 0 0 0  Suicidal thoughts  0 0 0 0  PHQ-9 Score  0 0 0 0  Difficult doing work/chores  Not difficult at all Not difficult at all Not difficult at all Not difficult at all      Fall Risk:  09/11/2023    9:28 AM 03/13/2023   11:22 AM 01/26/2023    9:36 AM 07/19/2022    9:08 AM 04/18/2022    9:48 AM  Fall Risk   Falls in the past year? 0 1 0 0 0  Number falls in past yr: 0 0 0 0 0  Injury with Fall?  0 0 0 0  Risk for fall due to : No Fall Risks Impaired balance/gait No Fall Risks No Fall Risks No Fall Risks  Follow up Falls prevention discussed Falls prevention discussed;Education provided;Falls evaluation completed Falls prevention discussed;Education provided;Falls evaluation completed Falls prevention discussed;Education provided Falls prevention discussed;Education provided      Functional Status Survey: Is the patient deaf or have difficulty hearing?: No Does the patient have difficulty seeing, even when wearing glasses/contacts?: No Does the patient have difficulty concentrating, remembering, or making decisions?: No Does the patient have difficulty walking or climbing stairs?: No Does the patient have difficulty dressing or bathing?: No Does the patient have difficulty doing errands alone such as visiting a doctor's office or shopping?: No    Assessment & Plan  Problem List Items Addressed This Visit       Other   Metabolic syndrome    Chronic, ongoing Currently on Zepbound 10 mg weekly injection Recommend she continues with weight loss efforts and exercise to assist with this Refills provided Will recheck lipid and A1c  Results to dictate further management  Follow up in 6 months or sooner if concerns arise        Relevant Medications   tirzepatide (ZEPBOUND) 10 MG/0.5ML Pen   Hyperlipidemia    Chronic, ongoing Not on statin or lowering medication at this time Has been losing weight steadily with GLP1 medication regimen Recheck lipid panel. Results to dictate further management  Follow up in 6 months or sooner if concerns arise        Relevant Orders   Lipid Profile   Prediabetes    Recheck A1c today Results to dictate further management         Relevant Orders   HgB A1c   Class 2 obesity with body mass index (BMI) of 38.0 to 38.9 in adult - Primary    Chronic, ongoing Currently managed with Zepbound 10 mg weekly injection Refills provided today as patient wants to stay at the 10 mg dose for now- 3 month supply sent in for now Recommend she continues with exercise efforts and engages in strength exercises to prevent muscle loss  She denies side effects or concerns today with administration- reports she has been taking for about 4-6 weeks so far without issues Reviewed that she should not go without the medication longer than 3 consecutive weeks and resume at higher doses as more serious side effects are likely if this happens- she voiced understanding and agreement to contact office if she has supply issues.  Follow up in 3 months or sooner if concerns arise        Relevant Medications   tirzepatide (ZEPBOUND) 10 MG/0.5ML Pen   Other Relevant Orders   CBC w/Diff/Platelet   TSH   Comprehensive metabolic panel   Other Visit Diagnoses     Encounter for weight management       Relevant Medications   tirzepatide (ZEPBOUND) 10 MG/0.5ML Pen        No follow-ups on file.   I, Luke Falero E Cynde Menard, PA-C, have reviewed all documentation for this visit. The documentation on 09/11/23 for the exam, diagnosis, procedures,  and orders are all accurate and complete.   Jacquelin Hawking, MHS, PA-C [facility]

## 2023-09-11 NOTE — Assessment & Plan Note (Signed)
Chronic, ongoing Not on statin or lowering medication at this time Has been losing weight steadily with GLP1 medication regimen Recheck lipid panel. Results to dictate further management  Follow up in 6 months or sooner if concerns arise

## 2023-12-12 ENCOUNTER — Ambulatory Visit: Payer: Self-pay | Admitting: Family Medicine

## 2023-12-12 ENCOUNTER — Encounter: Payer: Self-pay | Admitting: Family Medicine

## 2023-12-12 VITALS — BP 128/74 | HR 90 | Resp 16 | Ht 64.0 in | Wt 228.0 lb

## 2023-12-12 DIAGNOSIS — Z6839 Body mass index (BMI) 39.0-39.9, adult: Secondary | ICD-10-CM

## 2023-12-12 DIAGNOSIS — R7303 Prediabetes: Secondary | ICD-10-CM

## 2023-12-12 DIAGNOSIS — K219 Gastro-esophageal reflux disease without esophagitis: Secondary | ICD-10-CM | POA: Diagnosis not present

## 2023-12-12 DIAGNOSIS — E66812 Obesity, class 2: Secondary | ICD-10-CM

## 2023-12-12 DIAGNOSIS — E782 Mixed hyperlipidemia: Secondary | ICD-10-CM

## 2023-12-12 DIAGNOSIS — E8881 Metabolic syndrome: Secondary | ICD-10-CM

## 2023-12-12 DIAGNOSIS — Z5181 Encounter for therapeutic drug level monitoring: Secondary | ICD-10-CM

## 2023-12-12 DIAGNOSIS — Z7689 Persons encountering health services in other specified circumstances: Secondary | ICD-10-CM

## 2023-12-12 MED ORDER — ZEPBOUND 7.5 MG/0.5ML ~~LOC~~ SOAJ
7.5000 mg | SUBCUTANEOUS | 0 refills | Status: DC
Start: 2023-12-12 — End: 2024-01-18

## 2023-12-12 MED ORDER — TIRZEPATIDE-WEIGHT MANAGEMENT 10 MG/0.5ML ~~LOC~~ SOAJ
10.0000 mg | SUBCUTANEOUS | 0 refills | Status: DC
Start: 2024-01-10 — End: 2024-01-18

## 2023-12-12 NOTE — Progress Notes (Signed)
 Name: Sierra Buck   MRN: [id number]    DOB: 12/01/1960   Date:12/12/2023       Progress Note  Chief Complaint  Patient presents with   Medical Management of Chronic Issues     Subjective:   Sierra Buck is a 63 y.o. female, presents to clinic for routine follow up on chronic conditions  Here for f/up on zepbound med refill for obesity and weight loss Meds have also been helping with inflammation pain Has been on 10 mg dose since last June after switching from wegovy  Highest weight 247, she has been out of meds for 3+ weeks and gained back a little bit of weight  She was exercising but work got busy and she wasn't able to exercise as much She has cut out breads and sweets and it overall trying to eat healthy She did previously use noom but did not renew the subscription but it did help her with education and nutrition  Wt Readings from Last 15 Encounters:  12/12/23 228 lb (103.4 kg)  09/11/23 226 lb (102.5 kg)  03/13/23 221 lb 4.8 oz (100.4 kg)  02/20/23 208 lb (94.3 kg)  01/26/23 213 lb 1.6 oz (96.7 kg)  12/12/22 211 lb 3.2 oz (95.8 kg)  08/22/22 221 lb 6.4 oz (100.4 kg)  07/19/22 225 lb (102.1 kg)  04/18/22 238 lb (108 kg)  03/18/22 241 lb (109.3 kg)  02/15/22 247 lb (112 kg)  06/07/21 245 lb 1.6 oz (111.2 kg)  08/06/19 218 lb (98.9 kg)  07/30/18 218 lb (98.9 kg)  07/13/18 223 lb 1.6 oz (101.2 kg)   BMI Readings from Last 5 Encounters:  12/12/23 39.14 kg/m  09/11/23 38.79 kg/m  03/13/23 38.28 kg/m  02/20/23 35.98 kg/m  01/26/23 36.87 kg/m   She had noted nausea, bloating and indigestion with 10 mg dose She switched it to at bedtime and that helped a little Being off the meds for 3 weeks she still has some GERD sx - indigestion, reflux it can radiate to her back sometimes and she notes different food triggers, for example last night she ate something greasy and sx are worse today  Not on meds for GI sx   Current Outpatient Medications:     MULTIPLE VITAMINS PO, Take by mouth., Disp: , Rfl:    OVER THE COUNTER MEDICATION, Nutrafol, Disp: , Rfl:    tirzepatide (ZEPBOUND) 10 MG/0.5ML Pen, Inject 10 mg into the skin once a week., Disp: 6 mL, Rfl: 0   TURMERIC PO, Take by mouth daily., Disp: , Rfl:   Patient Active Problem List   Diagnosis Date Noted   History of colonic polyps 02/20/2023   Polyp of descending colon 02/20/2023   Obesity (BMI 30-39.9) 08/26/2022   Class 2 obesity with body mass index (BMI) of 38.0 to 38.9 in adult 07/21/2022   Unintended weight gain 02/17/2022   OSA on CPAP 02/17/2022   Bilateral lower extremity edema 02/17/2022   Metabolic syndrome 02/17/2022   Hyperlipidemia 02/17/2022   Prediabetes 02/17/2022   Morbid obesity with BMI of 40.0-44.9, adult (HCC) 03/02/2017   Seborrheic keratoses 03/02/2017    Past Surgical History:  Procedure Laterality Date   ABDOMINAL HYSTERECTOMY  2006   Partial   COLONOSCOPY WITH PROPOFOL N/A 04/26/2016   Procedure: COLONOSCOPY WITH PROPOFOL;  Surgeon: Earline Mayotte, MD;  Location: [facility] ENDOSCOPY;  Service: Endoscopy;  Laterality: N/A;   COLONOSCOPY WITH PROPOFOL N/A 02/20/2023   Procedure: COLONOSCOPY WITH BIOPSY;  Surgeon:  Midge Minium, MD;  Location: [facility];  Service: Endoscopy;  Laterality: N/A;  sleep apnea   POLYPECTOMY  02/20/2023   Procedure: POLYPECTOMY;  Surgeon: Midge Minium, MD;  Location: [facility];  Service: Endoscopy;;    Family History  Problem Relation Age of Onset   Hypertension Mother    Parkinson's disease Mother    Hypertension Sister    Hypertension Paternal Aunt        pat great aunt   Cancer Father        colon or lung ?   Breast cancer Paternal Aunt    Hernia Brother    ADD / ADHD Son    Cancer Maternal Grandfather        lung    Social History   Tobacco Use   Smoking status: Never   Smokeless tobacco: Never  Vaping Use   Vaping status: Never Used  Substance Use Topics   Alcohol use: Yes     Alcohol/week: 0.0 standard drinks of alcohol    Comment: occasional   Drug use: No     No Known Allergies  Health Maintenance  Topic Date Due   Cervical Cancer Screening (HPV/Pap Cotest)  Never done   COVID-19 Vaccine (3 - Pfizer risk series) 12/27/2023 (Originally 03/10/2020)   INFLUENZA VACCINE  01/08/2024 (Originally 05/11/2023)   Zoster Vaccines- Shingrix (2 of 2) 03/12/2024 (Originally 06/13/2021)   MAMMOGRAM  04/03/2025   Colonoscopy  02/19/2030   DTaP/Tdap/Td (2 - Td or Tdap) 06/08/2031   Hepatitis C Screening  Completed   HIV Screening  Completed   HPV VACCINES  Aged Out    Chart Review Today: I personally reviewed active problem list, medication list, allergies, family history, social history, health maintenance, notes from last encounter, lab results, imaging with the patient/caregiver today.   Review of Systems  Constitutional: Negative.   HENT: Negative.    Eyes: Negative.   Respiratory: Negative.    Cardiovascular: Negative.   Gastrointestinal: Negative.   Endocrine: Negative.   Genitourinary: Negative.   Musculoskeletal: Negative.   Skin: Negative.   Allergic/Immunologic: Negative.   Neurological: Negative.   Hematological: Negative.   Psychiatric/Behavioral: Negative.    All other systems reviewed and are negative.    Objective:   Vitals:   12/12/23 1008  BP: 128/74  Pulse: 90  Resp: 16  SpO2: 99%  Weight: 228 lb (103.4 kg)  Height: 5\' 4"  (1.626 m)    Body mass index is 39.14 kg/m.  Physical Exam Vitals and nursing note reviewed.  Constitutional:      General: She is not in acute distress.    Appearance: Normal appearance. She is well-developed and well-groomed. She is obese. She is not ill-appearing, toxic-appearing or diaphoretic.     Comments: Well appearing, looks younger than stated age  HENT:     Head: Normocephalic and atraumatic.     Nose: Nose normal.  Eyes:     General:        Right eye: No discharge.        Left eye: No  discharge.     Conjunctiva/sclera: Conjunctivae normal.  Neck:     Trachea: No tracheal deviation.  Cardiovascular:     Rate and Rhythm: Normal rate and regular rhythm.     Pulses: Normal pulses.     Heart sounds: Normal heart sounds. No murmur heard.    No friction rub. No gallop.  Pulmonary:     Effort: Pulmonary effort is normal. No respiratory  distress.     Breath sounds: Normal breath sounds. No stridor. No wheezing, rhonchi or rales.  Skin:    General: Skin is warm and dry.     Findings: No rash.  Neurological:     Mental Status: She is alert.     Motor: No abnormal muscle tone.     Coordination: Coordination normal.  Psychiatric:        Mood and Affect: Mood normal.        Behavior: Behavior normal. Behavior is cooperative.      Functional Status Survey:   Results for orders placed or performed in visit on 02/15/22  CBC with Differential/Platelet   Collection Time: 02/16/22  2:09 PM  Result Value Ref Range   WBC 5.1 3.4 - 10.8 x10E3/uL   RBC 4.78 3.77 - 5.28 x10E6/uL   Hemoglobin 13.5 11.1 - 15.9 g/dL   Hematocrit 14.7 82.9 - 46.6 %   MCV 86 79 - 97 fL   MCH 28.2 26.6 - 33.0 pg   MCHC 32.7 31.5 - 35.7 g/dL   RDW 56.2 13.0 - 86.5 %   Platelets 289 150 - 450 x10E3/uL   Neutrophils 36 Not Estab. %   Lymphs 49 Not Estab. %   Monocytes 8 Not Estab. %   Eos 6 Not Estab. %   Basos 1 Not Estab. %   Neutrophils Absolute 1.8 1.4 - 7.0 x10E3/uL   Lymphocytes Absolute 2.5 0.7 - 3.1 x10E3/uL   Monocytes Absolute 0.4 0.1 - 0.9 x10E3/uL   EOS (ABSOLUTE) 0.3 0.0 - 0.4 x10E3/uL   Basophils Absolute 0.0 0.0 - 0.2 x10E3/uL   Immature Granulocytes 0 Not Estab. %   Immature Grans (Abs) 0.0 0.0 - 0.1 x10E3/uL  Lipid panel   Collection Time: 02/16/22  2:09 PM  Result Value Ref Range   Cholesterol, Total 261 (H) 100 - 199 mg/dL   Triglycerides 784 (H) 0 - 149 mg/dL   HDL 75 >69 mg/dL   VLDL Cholesterol Cal 27 5 - 40 mg/dL   LDL Chol Calc (NIH) 629 (H) 0 - 99 mg/dL    Chol/HDL Ratio 3.5 0.0 - 4.4 ratio  Hemoglobin A1c   Collection Time: 02/16/22  2:09 PM  Result Value Ref Range   Hgb A1c MFr Bld 6.0 (H) 4.8 - 5.6 %   Est. average glucose Bld gHb Est-mCnc 126 mg/dL  Comprehensive metabolic panel   Collection Time: 02/16/22  2:09 PM  Result Value Ref Range   Glucose 83 70 - 99 mg/dL   BUN 13 6 - 24 mg/dL   Creatinine, Ser 5.28 0.57 - 1.00 mg/dL   eGFR 98 >41 LK/GMW/1.02   BUN/Creatinine Ratio 18 9 - 23   Sodium 140 134 - 144 mmol/L   Potassium 3.9 3.5 - 5.2 mmol/L   Chloride 101 96 - 106 mmol/L   CO2 24 20 - 29 mmol/L   Calcium 9.5 8.7 - 10.2 mg/dL   Total Protein 7.4 6.0 - 8.5 g/dL   Albumin 4.3 3.8 - 4.9 g/dL   Globulin, Total 3.1 1.5 - 4.5 g/dL   Albumin/Globulin Ratio 1.4 1.2 - 2.2   Bilirubin Total 0.3 0.0 - 1.2 mg/dL   Alkaline Phosphatase 109 44 - 121 IU/L   AST 18 0 - 40 IU/L   ALT 22 0 - 32 IU/L  TSH + free T4   Collection Time: 02/16/22  2:09 PM  Result Value Ref Range   TSH 2.430 0.450 - 4.500 uIU/mL   Free  T4 1.07 0.82 - 1.77 ng/dL  Insulin, random   Collection Time: 02/16/22  2:09 PM  Result Value Ref Range   INSULIN 12.8 2.6 - 24.9 uIU/mL      Assessment & Plan:   Encounter for weight management Assessment & Plan: Reviewed diet/lifestyle efforts that compliment zepbound, discussed resources  -     Tirzepatide-Weight Management; Inject 10 mg into the skin once a week.  Dispense: 6 mL; Refill: 0 -     Zepbound; Inject 7.5 mg into the skin once a week.  Dispense: 2 mL; Refill: 0  Metabolic syndrome Assessment & Plan: Chronic, ongoing Restart zepbound as noted above Recommend she continues with weight loss efforts and exercise to assist with this Recommend rechecking labs in the next 3-6 months to monitor improvement with metabolic dysfunction markers in lipid and A1c Prediabetic and high lipids  Orders: -     Tirzepatide-Weight Management; Inject 10 mg into the skin once a week.  Dispense: 6 mL; Refill: 0 -      Zepbound; Inject 7.5 mg into the skin once a week.  Dispense: 2 mL; Refill: 0 -     CBC with Differential/Platelet -     COMPLETE METABOLIC PANEL WITH GFR -     Hemoglobin A1c -     Lipid panel  Gastroesophageal reflux disease, unspecified whether esophagitis present Assessment & Plan: Suspect some underlying GERD  She is symptomatic 3 weeks off zepbound Encouraged her to manage with diet/lifestyle efforts, avoid triggers Trial of pepcid OTC 20 mg BID, she may also consider PPI trial for 2-4 weeks Hopefully improving GERD control will also allow her to tolerate zepbound better  Class 2 obesity with body mass index (BMI) of 39.0 to 39.9 in adult, unspecified obesity type, unspecified whether serious comorbidity present Assessment & Plan: Pt is still down about 19-20 lbs from high weight with help of wegovy and then she switched to zepbound She has been on 10 mg dose for about 8 months and mostly tolerating this with some nausea on the day or two around the injection No vomiting She has been out meds for 3 weeks Will restart her on 7.5 mg dose for a few weeks and then she can resume 10 mg dose She will check with her insurance for 1 vs 3 month coverage at the preferred pharmacy. We have been doing 12 week supply but it seems she is only getting 4 weeks Rxs sent to her She would like to continue zepbound, encouraged her to continue healthy diet efforts and increase physical activity Orders: -     Tirzepatide-Weight Management; Inject 10 mg into the skin once a week.  Dispense: 6 mL; Refill: 0 -     Zepbound; Inject 7.5 mg into the skin once a week.  Dispense: 2 mL; Refill: 0  Prediabetes Assessment & Plan: Recommend repeating A1C in the next 3-6 months Lab Results  Component Value Date   HGBA1C 6.0 (H) 02/16/2022  Orders: -     COMPLETE METABOLIC PANEL WITH GFR -     Hemoglobin A1c  Mixed hyperlipidemia Assessment & Plan: Not on meds Lab Results  Component Value Date   CHOL  261 (H) 02/16/2022   HDL 75 02/16/2022   LDLCALC 159 (H) 02/16/2022   TRIG 153 (H) 02/16/2022   CHOLHDL 3.5 02/16/2022  The 10-year ASCVD risk score (Arnett DK, et al., 2019) is: 5.5%   Values used to calculate the score:     Age: 67 years  Sex: Female     Is Non-Hispanic African American: Yes     Diabetic: No     Tobacco smoker: No     Systolic Blood Pressure: 128 mmHg     Is BP treated: No     HDL Cholesterol: 75 mg/dL     Total Cholesterol: 261 mg/dL  Orders: -     COMPLETE METABOLIC PANEL WITH GFR -     Lipid panel  Encounter for medication monitoring -     CBC with Differential/Platelet -     COMPLETE METABOLIC PANEL WITH GFR -     Hemoglobin A1c -     Lipid panel   Labs prior to next f/up appt Or pt can schedule CPE and do labs with physical   Plan for pt written out on AVS:  You can try pepcid 10-20 mg over the counter twice a day a few days before and after getting back on zepbound can help settle your stomach and avoid worse acid reflux/indigestion  If you continue to have symptoms of indigestion, reflux, bloating, acid - then I would try a 2 week treatment with over the counter omeprazole (prilosec)  If symptoms continue after prilosec please get a follow up appointment to discuss prescription medications.  WE do need to recheck your labs soon - in the next 3-6 months. It is important to understanding your health and metabolic dysfunction and to see the improvement with these medications and all your weight loss efforts.  I will order the labs today and you can complete them before your next follow up for the med refills.  Or you can schedule a complete physical and we can do all your labs then.   Return for 4-6 months med refills routine f/up .   Danelle Berry, PA-C 12/12/23 10:19 AM

## 2023-12-12 NOTE — Assessment & Plan Note (Signed)
 Pt is still down about 19-20 lbs from high weight with help of wegovy and then she switched to zepbound She has been on 10 mg dose for about 8 months and mostly tolerating this with some nausea on the day or two around the injection No vomiting She has been out meds for 3 weeks Will restart her on 7.5 mg dose for a few weeks and then she can resume 10 mg dose She will check with her insurance for 1 vs 3 month coverage at the preferred pharmacy. We have been doing 12 week supply but it seems she is only getting 4 weeks Rxs sent to her  She would like to continue zepbound, encouraged her to continue healthy diet efforts and increase physical activity

## 2023-12-12 NOTE — Assessment & Plan Note (Signed)
 Recommend repeating A1C in the next 3-6 months Lab Results  Component Value Date   HGBA1C 6.0 (H) 02/16/2022

## 2023-12-12 NOTE — Patient Instructions (Addendum)
 You can try pepcid 10-20 mg over the counter twice a day a few days before and after getting back on zepbound can help settle your stomach and avoid worse acid reflux/indigestion  If you continue to have symptoms of indigestion, reflux, bloating, acid - then I would try a 2 week treatment with over the counter omeprazole (prilosec)  If symptoms continue after prilosec please get a follow up appointment to discuss prescription medications.  WE do need to recheck your labs soon - in the next 3-6 months. It is important to understanding your health and metabolic dysfunction and to see the improvement with these medications and all your weight loss efforts.  I will order the labs today and you can complete them before your next follow up for the med refills.  Or you can schedule a complete physical and we can do all your labs then.

## 2023-12-12 NOTE — Assessment & Plan Note (Signed)
 Chronic, ongoing Restart zepbound as noted above Recommend she continues with weight loss efforts and exercise to assist with this Recommend rechecking labs in the next 3-6 months to monitor improvement with metabolic dysfunction markers in lipid and A1c Prediabetic and high lipids

## 2023-12-12 NOTE — Assessment & Plan Note (Signed)
 Not on meds Lab Results  Component Value Date   CHOL 261 (H) 02/16/2022   HDL 75 02/16/2022   LDLCALC 159 (H) 02/16/2022   TRIG 153 (H) 02/16/2022   CHOLHDL 3.5 02/16/2022

## 2023-12-12 NOTE — Assessment & Plan Note (Signed)
 Suspect some underlying GERD  She is symptomatic 3 weeks off zepbound Encouraged her to manage with diet/lifestyle efforts, avoid triggers Trial of pepcid OTC 20 mg BID, she may also consider PPI trial for 2-4 weeks Hopefully improving GERD control will also allow her to tolerate zepbound better

## 2024-01-12 ENCOUNTER — Telehealth: Payer: Self-pay

## 2024-01-12 DIAGNOSIS — E8881 Metabolic syndrome: Secondary | ICD-10-CM

## 2024-01-12 DIAGNOSIS — Z7689 Persons encountering health services in other specified circumstances: Secondary | ICD-10-CM

## 2024-01-12 DIAGNOSIS — Z6839 Body mass index (BMI) 39.0-39.9, adult: Secondary | ICD-10-CM

## 2024-01-12 NOTE — Telephone Encounter (Signed)
 Copied from CRM [id number]. Topic: Clinical - Medication Question >> Jan 12, 2024  1:16 PM Benay Spice S wrote: Reason for CRM: Patient called and stated the prescription for tirzepatide (ZEPBOUND) 7.5 MG/0.5ML Pen was denied. Provider stated to call and inform her if it was. Patient can be contacted back at [phone].

## 2024-01-18 MED ORDER — ZEPBOUND 7.5 MG/0.5ML ~~LOC~~ SOAJ
7.5000 mg | SUBCUTANEOUS | 0 refills | Status: DC
Start: 2024-01-18 — End: 2024-04-03

## 2024-01-18 NOTE — Addendum Note (Signed)
 Addended by: Danelle Berry on: 01/18/2024 02:02 PM   Modules accepted: Orders

## 2024-01-18 NOTE — Addendum Note (Signed)
 Addended by: Danelle Berry on: 01/18/2024 10:06 AM   Modules accepted: Orders

## 2024-01-18 NOTE — Telephone Encounter (Signed)
 PA for Zepbound 7.5mg  submitted

## 2024-01-18 NOTE — Telephone Encounter (Signed)
 ZEPBOUND INJ 7.5/0.5 is approved through 07/19/2024. Patient notified

## 2024-01-31 ENCOUNTER — Encounter: Payer: Self-pay | Admitting: Family Medicine

## 2024-01-31 ENCOUNTER — Ambulatory Visit: Payer: Self-pay | Admitting: Family Medicine

## 2024-01-31 VITALS — BP 118/70 | HR 96 | Temp 97.9°F | Ht 64.0 in | Wt 228.6 lb

## 2024-01-31 DIAGNOSIS — E8881 Metabolic syndrome: Secondary | ICD-10-CM | POA: Diagnosis not present

## 2024-01-31 DIAGNOSIS — R7303 Prediabetes: Secondary | ICD-10-CM

## 2024-01-31 DIAGNOSIS — Z5181 Encounter for therapeutic drug level monitoring: Secondary | ICD-10-CM

## 2024-01-31 DIAGNOSIS — E782 Mixed hyperlipidemia: Secondary | ICD-10-CM

## 2024-01-31 DIAGNOSIS — G4733 Obstructive sleep apnea (adult) (pediatric): Secondary | ICD-10-CM

## 2024-01-31 DIAGNOSIS — E66812 Obesity, class 2: Secondary | ICD-10-CM

## 2024-01-31 DIAGNOSIS — Z6839 Body mass index (BMI) 39.0-39.9, adult: Secondary | ICD-10-CM

## 2024-01-31 DIAGNOSIS — Z7689 Persons encountering health services in other specified circumstances: Secondary | ICD-10-CM

## 2024-01-31 NOTE — Patient Instructions (Signed)
 Please let me know in about 4 weeks what you are thinking about your zepbound  dosing. You can increase back to 10, 12.5, then max is 15 mg dose We may need to do a follow up appointment in the next couple months to document the side effects, weight loss, etc to continue to get meds approved.

## 2024-01-31 NOTE — Progress Notes (Signed)
 Patient ID: Sierra Buck, female    DOB: 1961/02/09, 63 y.o.   MRN: [id number]  PCP: Adeline Hone, PA-C  Chief Complaint  Patient presents with   Medical Management of Chronic Issues   Weight Management Screening    Subjective:   Sierra Buck is a 63 y.o. female, presents to clinic with CC of the following:  HPI   Got 7.5 mg zepbound  approved again, she had been out for a while due to difficulty getting dose change approved (possibly last meds were in Dec - so out for months, not weeks) Restarted 7.5 mg dose doing them on Tuesdays, last week very ill from med SE, a little better this week with the second dose   Wt Readings from Last 5 Encounters:  01/31/24 228 lb 10.1 oz (103.7 kg)  12/12/23 228 lb (103.4 kg)  09/11/23 226 lb (102.5 kg)  03/13/23 221 lb 4.8 oz (100.4 kg)  02/20/23 208 lb (94.3 kg)   BMI Readings from Last 5 Encounters:  01/31/24 39.24 kg/m  12/12/23 39.14 kg/m  09/11/23 38.79 kg/m  03/13/23 38.28 kg/m  02/20/23 35.98 kg/m   She isn't exercising much right now due to demands of time/stress at work Not on a particular diet plan, she did [employer] in the past didn't like some aspects of it, did noom as well She will look into employee resources/benefits Referred to nutritionist/RD - we'll see if covered?   Patient Active Problem List   Diagnosis Date Noted   Gastroesophageal reflux disease 12/12/2023   History of colonic polyps 02/20/2023   Polyp of descending colon 02/20/2023   Class 2 obesity with body mass index (BMI) of 39.0 to 39.9 in adult 07/21/2022   OSA on CPAP 02/17/2022   Bilateral lower extremity edema 02/17/2022   Metabolic syndrome 02/17/2022   Hyperlipidemia 02/17/2022   Prediabetes 02/17/2022   Seborrheic keratoses 03/02/2017      Current Outpatient Medications:    MULTIPLE VITAMINS PO, Take by mouth., Disp: , Rfl:    OVER THE COUNTER MEDICATION, Nutrafol, Disp: , Rfl:    tirzepatide  (ZEPBOUND ) 7.5 MG/0.5ML Pen,  Inject 7.5 mg into the skin once a week., Disp: 6 mL, Rfl: 0   TURMERIC PO, Take by mouth daily., Disp: , Rfl:    No Known Allergies   Social History   Tobacco Use   Smoking status: Never   Smokeless tobacco: Never  Vaping Use   Vaping status: Never Used  Substance Use Topics   Alcohol use: Yes    Alcohol/week: 0.0 standard drinks of alcohol    Comment: occasional   Drug use: No      Chart Review Today: I personally reviewed active problem list, medication list, allergies, family history, social history, health maintenance, notes from last encounter, lab results, imaging with the patient/caregiver today.   Review of Systems  Constitutional: Negative.   HENT: Negative.    Eyes: Negative.   Respiratory: Negative.    Cardiovascular: Negative.   Gastrointestinal: Negative.   Endocrine: Negative.   Genitourinary: Negative.   Musculoskeletal: Negative.   Skin: Negative.   Allergic/Immunologic: Negative.   Neurological: Negative.   Hematological: Negative.   Psychiatric/Behavioral: Negative.    All other systems reviewed and are negative.      Objective:   Vitals:   01/31/24 0929  BP: 118/70  Pulse: 96  Temp: 97.9 F (36.6 C)  TempSrc: Oral  SpO2: 98%  Weight: 228 lb 10.1 oz (103.7 kg)  Height: 5'  4" (1.626 m)    Body mass index is 39.24 kg/m.  Physical Exam Vitals and nursing note reviewed.  Constitutional:      General: She is not in acute distress.    Appearance: Normal appearance. She is well-developed. She is obese. She is not ill-appearing, toxic-appearing or diaphoretic.  HENT:     Head: Normocephalic and atraumatic.     Nose: Nose normal.  Eyes:     General:        Right eye: No discharge.        Left eye: No discharge.     Conjunctiva/sclera: Conjunctivae normal.  Neck:     Trachea: No tracheal deviation.  Cardiovascular:     Rate and Rhythm: Normal rate.     Heart sounds: No murmur heard. Pulmonary:     Effort: Pulmonary effort is  normal. No respiratory distress.     Breath sounds: No stridor.  Skin:    General: Skin is warm and dry.     Findings: No rash.  Neurological:     Mental Status: She is alert.     Motor: No abnormal muscle tone.     Coordination: Coordination normal.     Gait: Gait normal.  Psychiatric:        Mood and Affect: Mood normal.        Behavior: Behavior normal.      Results for orders placed or performed in visit on 02/15/22  CBC with Differential/Platelet   Collection Time: 02/16/22  2:09 PM  Result Value Ref Range   WBC 5.1 3.4 - 10.8 x10E3/uL   RBC 4.78 3.77 - 5.28 x10E6/uL   Hemoglobin 13.5 11.1 - 15.9 g/dL   Hematocrit 16.1 09.6 - 46.6 %   MCV 86 79 - 97 fL   MCH 28.2 26.6 - 33.0 pg   MCHC 32.7 31.5 - 35.7 g/dL   RDW 04.5 40.9 - 81.1 %   Platelets 289 150 - 450 x10E3/uL   Neutrophils 36 Not Estab. %   Lymphs 49 Not Estab. %   Monocytes 8 Not Estab. %   Eos 6 Not Estab. %   Basos 1 Not Estab. %   Neutrophils Absolute 1.8 1.4 - 7.0 x10E3/uL   Lymphocytes Absolute 2.5 0.7 - 3.1 x10E3/uL   Monocytes Absolute 0.4 0.1 - 0.9 x10E3/uL   EOS (ABSOLUTE) 0.3 0.0 - 0.4 x10E3/uL   Basophils Absolute 0.0 0.0 - 0.2 x10E3/uL   Immature Granulocytes 0 Not Estab. %   Immature Grans (Abs) 0.0 0.0 - 0.1 x10E3/uL  Lipid panel   Collection Time: 02/16/22  2:09 PM  Result Value Ref Range   Cholesterol, Total 261 (H) 100 - 199 mg/dL   Triglycerides 914 (H) 0 - 149 mg/dL   HDL 75 >78 mg/dL   VLDL Cholesterol Cal 27 5 - 40 mg/dL   LDL Chol Calc (NIH) 295 (H) 0 - 99 mg/dL   Chol/HDL Ratio 3.5 0.0 - 4.4 ratio  Hemoglobin A1c   Collection Time: 02/16/22  2:09 PM  Result Value Ref Range   Hgb A1c MFr Bld 6.0 (H) 4.8 - 5.6 %   Est. average glucose Bld gHb Est-mCnc 126 mg/dL  Comprehensive metabolic panel   Collection Time: 02/16/22  2:09 PM  Result Value Ref Range   Glucose 83 70 - 99 mg/dL   BUN 13 6 - 24 mg/dL   Creatinine, Ser 6.21 0.57 - 1.00 mg/dL   eGFR 98 >30 QM/VHQ/4.69    BUN/Creatinine Ratio 18  9 - 23   Sodium 140 134 - 144 mmol/L   Potassium 3.9 3.5 - 5.2 mmol/L   Chloride 101 96 - 106 mmol/L   CO2 24 20 - 29 mmol/L   Calcium 9.5 8.7 - 10.2 mg/dL   Total Protein 7.4 6.0 - 8.5 g/dL   Albumin 4.3 3.8 - 4.9 g/dL   Globulin, Total 3.1 1.5 - 4.5 g/dL   Albumin/Globulin Ratio 1.4 1.2 - 2.2   Bilirubin Total 0.3 0.0 - 1.2 mg/dL   Alkaline Phosphatase 109 44 - 121 IU/L   AST 18 0 - 40 IU/L   ALT 22 0 - 32 IU/L  TSH + free T4   Collection Time: 02/16/22  2:09 PM  Result Value Ref Range   TSH 2.430 0.450 - 4.500 uIU/mL   Free T4 1.07 0.82 - 1.77 ng/dL  Insulin , random   Collection Time: 02/16/22  2:09 PM  Result Value Ref Range   INSULIN  12.8 2.6 - 24.9 uIU/mL       Assessment & Plan:     ICD-10-CM   1. Metabolic syndrome  E88.810 Amb ref to Medical Nutrition Therapy-[facility]    Comprehensive metabolic panel with GFR    CBC with Differential/Platelet    Hemoglobin A1c    Lipid panel    TSH    T4, free   working on healthier diet/lifestyle, on zepbound     2. Class 2 obesity with body mass index (BMI) of 39.0 to 39.9 in adult, unspecified obesity type, unspecified whether serious comorbidity present  E66.812 Amb ref to Medical Nutrition Therapy-[facility]   Z68.39 Comprehensive metabolic panel with GFR    CBC with Differential/Platelet    Hemoglobin A1c    Lipid panel    TSH    T4, free   got back on zepbound , down a few lbs at home on her scale    3. Prediabetes  R73.03 Amb ref to Medical Nutrition Therapy-M[facility]    Comprehensive metabolic panel with GFR    Hemoglobin A1c   recheck labs today - printed and she will do at [facility]    4. Mixed hyperlipidemia  E78.2 Amb ref to Medical Nutrition Therapy-[facility]    Comprehensive metabolic panel with GFR    Lipid panel   she is fasting, will do lipids today, not on meds    5. OSA on CPAP  G47.33 Amb ref to Medical Nutrition Therapy-[facility]    CBC with Differential/Platelet    6. Encounter for weight management   Z76.89 Amb ref to Medical Nutrition Therapy-[facility]    Comprehensive metabolic panel with GFR    CBC with Differential/Platelet    Hemoglobin A1c    Lipid panel    TSH    T4, free   she restarted zepbound  after being out for actually a few months, GI SE, discussed diet/lifestyle efforts, nutrition/calorie goals    7. Encounter for medication monitoring  Z51.81 Comprehensive metabolic panel with GFR    CBC with Differential/Platelet    Hemoglobin A1c    Lipid panel    TSH    T4, free     F/up appt recommended in ~4to 6 weeks to f/up on zepbound  med/dose/SE  She can reschedule CPE      Adeline Hone, PA-C 01/31/24 9:36 AM

## 2024-02-01 ENCOUNTER — Encounter: Payer: Self-pay | Admitting: Family Medicine

## 2024-02-01 LAB — COMPREHENSIVE METABOLIC PANEL WITH GFR
ALT: 16 IU/L (ref 0–32)
AST: 18 IU/L (ref 0–40)
Albumin: 4.1 g/dL (ref 3.8–4.9)
Alkaline Phosphatase: 92 IU/L (ref 44–121)
BUN/Creatinine Ratio: 16 (ref 12–28)
BUN: 14 mg/dL (ref 8–27)
Bilirubin Total: 0.2 mg/dL (ref 0.0–1.2)
CO2: 27 mmol/L (ref 20–29)
Calcium: 9.6 mg/dL (ref 8.7–10.3)
Chloride: 102 mmol/L (ref 96–106)
Creatinine, Ser: 0.86 mg/dL (ref 0.57–1.00)
Globulin, Total: 2.8 g/dL (ref 1.5–4.5)
Glucose: 81 mg/dL (ref 70–99)
Potassium: 4.4 mmol/L (ref 3.5–5.2)
Sodium: 141 mmol/L (ref 134–144)
Total Protein: 6.9 g/dL (ref 6.0–8.5)
eGFR: 77 mL/min/{1.73_m2} (ref >59)

## 2024-02-01 LAB — CBC WITH DIFFERENTIAL/PLATELET
Basophils Absolute: 0 10*3/uL (ref 0.0–0.2)
Basos: 1 %
EOS (ABSOLUTE): 0.3 10*3/uL (ref 0.0–0.4)
Eos: 6 %
Hematocrit: 40.6 % (ref 34.0–46.6)
Hemoglobin: 13.1 g/dL (ref 11.1–15.9)
Immature Grans (Abs): 0 10*3/uL (ref 0.0–0.1)
Immature Granulocytes: 0 %
Lymphocytes Absolute: 1.9 10*3/uL (ref 0.7–3.1)
Lymphs: 44 %
MCH: 28.4 pg (ref 26.6–33.0)
MCHC: 32.3 g/dL (ref 31.5–35.7)
MCV: 88 fL (ref 79–97)
Monocytes Absolute: 0.4 10*3/uL (ref 0.1–0.9)
Monocytes: 8 %
Neutrophils Absolute: 1.8 10*3/uL (ref 1.4–7.0)
Neutrophils: 41 %
Platelets: 278 10*3/uL (ref 150–450)
RBC: 4.62 x10E6/uL (ref 3.77–5.28)
RDW: 12.8 % (ref 11.7–15.4)
WBC: 4.4 10*3/uL (ref 3.4–10.8)

## 2024-02-01 LAB — T4, FREE: Free T4: 1.22 ng/dL (ref 0.82–1.77)

## 2024-02-01 LAB — HEMOGLOBIN A1C
Est. average glucose Bld gHb Est-mCnc: 120 mg/dL
Hgb A1c MFr Bld: 5.8 % — ABNORMAL HIGH (ref 4.8–5.6)

## 2024-02-01 LAB — LIPID PANEL
Chol/HDL Ratio: 3 ratio (ref 0.0–4.4)
Cholesterol, Total: 221 mg/dL — ABNORMAL HIGH (ref 100–199)
HDL: 73 mg/dL (ref >39)
LDL Chol Calc (NIH): 136 mg/dL — ABNORMAL HIGH (ref 0–99)
Triglycerides: 66 mg/dL (ref 0–149)
VLDL Cholesterol Cal: 12 mg/dL (ref 5–40)

## 2024-02-01 LAB — TSH: TSH: 1.68 u[IU]/mL (ref 0.450–4.500)

## 2024-03-28 ENCOUNTER — Encounter: Attending: Family Medicine | Admitting: Dietician

## 2024-03-28 VITALS — Ht 64.0 in | Wt 225.9 lb

## 2024-03-28 DIAGNOSIS — E66812 Obesity, class 2: Secondary | ICD-10-CM | POA: Diagnosis not present

## 2024-03-28 DIAGNOSIS — Z6839 Body mass index (BMI) 39.0-39.9, adult: Secondary | ICD-10-CM | POA: Diagnosis not present

## 2024-03-28 DIAGNOSIS — E782 Mixed hyperlipidemia: Secondary | ICD-10-CM | POA: Insufficient documentation

## 2024-03-28 DIAGNOSIS — E8881 Metabolic syndrome: Secondary | ICD-10-CM | POA: Diagnosis present

## 2024-03-28 DIAGNOSIS — E669 Obesity, unspecified: Secondary | ICD-10-CM

## 2024-03-28 DIAGNOSIS — G4733 Obstructive sleep apnea (adult) (pediatric): Secondary | ICD-10-CM | POA: Diagnosis not present

## 2024-03-28 DIAGNOSIS — Z713 Dietary counseling and surveillance: Secondary | ICD-10-CM | POA: Insufficient documentation

## 2024-03-28 DIAGNOSIS — R7303 Prediabetes: Secondary | ICD-10-CM

## 2024-03-28 NOTE — Progress Notes (Signed)
 Medical Nutrition Therapy: Visit start time: 1100  end time: 1200  Assessment:   Referral Diagnosis: metabolic syndrome with prediabetes, hyperlipidemia, obesity Other medical history/ diagnoses: sleep apnea Psychosocial issues/ stress concerns: none  Medications, supplements: reconciled list in medical record    Current weight: 225.9lbs Height: 5'4 BMI: 38.78   Progress and evaluation:  Patient feels achieving weight loss will help reduce her health risk overall She has been working to reduce intake of sweets, reports craving chocolate mostly. She has also reduced sugary beverages.  Receives meal service Factor 75 2x a week (6 meals total) Recent lab results: (01/31/24) total cholesterol 221, HDL 73, LDL 136, triglycerides 66, HbA1C 5.8% Food allergies: none known    Dietary Intake:  Usual eating pattern includes 2-3 meals and 2 snacks per day.  Who plans meals/ buys groceries? Self Who prepares meals? self  Breakfast: usually skips; occasionally smoothie or fruit Snack: none Lunch: sometimes meal delivery; sandwich with roast beef or [place]; leftovers Snack: chocolate; frit ie apple/ mango/ banana/ sometimes soda/ rarely a few chips Supper: meal delivery 3x a week chicken/ salmon; spaghetti/ roast beef with greens/ [place] burger Snack: same as pm; occ sweet ie cake or ice cream Beverages: water , cranberry juice, occ [place] soda (with cane sugar - 35g per 12oz)  Physical activity: no regular exercise; works from home, sedentary   Intervention:   Nutrition Care Education:   Basic nutrition: basic food groups; appropriate nutrient balance; appropriate meal and snack schedule; general nutrition guidelines, Mediterranean eating pattern   Weight control: importance of low sugar and low fat choices; portion control strategies including measuring/ hand estimates of portion sizes, pre-portioning snacks; estimated energy needs for weight loss at 1400 kcal, provided guidance for 45%  CHO, 25 % pro, (30% fat); role of physical activity and suitable options, including increasing daily movement, finding enjoyable activities prediabetes:  appropriate carb intake and balance, healthy carb choices; role of fiber, protein, physical activity Hypertension:  identifying food sources of potassium, magnesium; options for seasoning foods Hyperlipidemia: healthy and unhealthy fats; role of fiber, plant sterols; role of exercise   Other intervention notes: Patient has begun making positive and appropriate dietary changes; she is motivated to continue. Established additional goals for change with direction from patient.    Nutritional Diagnosis:  [place]-2.1 Inpaired nutrition utilization and [place]-2.2 Altered nutrition-related laboratory As related to metabolic syndrome.  As evidenced by elevated total cholesterol and LDL, elevated HbA1C, sleep apnea. [place]-3.3 Overweight/obesity As related to excess calories, inadequate physical activity.  As evidenced by patient with current BMI of 38.78.   Education Materials given:  Designer, industrial/product with food lists, sample meal pattern Get Healthy with Mediterranean-Style Eating Visit summary with goals/ instructions   Learner/ who was taught:  Patient   Level of understanding: Verbalizes/ demonstrates competency  Demonstrated degree of understanding via:   Teach back Learning barriers: None  Willingness to learn/ readiness for change: Eager, change in progress  Monitoring and Evaluation:  Dietary intake, exercise, and body weight      follow up: 06/03/24

## 2024-03-28 NOTE — Patient Instructions (Addendum)
 Control portions of starchy foods to less then 1 cup (between handful-size and fist-size). Choose some whole grain options.  Keep portions of meats to palm size (3oz), ok to eat less at some meals if not very hungry, especially if drinking a protein drink during the day.  Reduce sweets/ cravings by having fruit for snacks, or lower calorie options such as 1-2 graham crackers, smaller portions of sweets (1oz chocolate). Incorporate more activity into each day by taking short breaks to move a few minutes at a time, by scheduling in some time for exercise starting with short length and gradually increasing. Find enjoyable activities to help with motivation.

## 2024-04-01 ENCOUNTER — Telehealth: Admitting: Family Medicine

## 2024-04-01 DIAGNOSIS — R7303 Prediabetes: Secondary | ICD-10-CM

## 2024-04-01 DIAGNOSIS — E66812 Obesity, class 2: Secondary | ICD-10-CM

## 2024-04-01 DIAGNOSIS — E8881 Metabolic syndrome: Secondary | ICD-10-CM

## 2024-04-01 DIAGNOSIS — Z6839 Body mass index (BMI) 39.0-39.9, adult: Secondary | ICD-10-CM

## 2024-04-01 DIAGNOSIS — E782 Mixed hyperlipidemia: Secondary | ICD-10-CM

## 2024-04-03 ENCOUNTER — Telehealth (INDEPENDENT_AMBULATORY_CARE_PROVIDER_SITE_OTHER): Admitting: Family Medicine

## 2024-04-03 ENCOUNTER — Encounter: Payer: Self-pay | Admitting: Family Medicine

## 2024-04-03 VITALS — Wt 225.0 lb

## 2024-04-03 DIAGNOSIS — E66812 Obesity, class 2: Secondary | ICD-10-CM

## 2024-04-03 DIAGNOSIS — Z6839 Body mass index (BMI) 39.0-39.9, adult: Secondary | ICD-10-CM | POA: Diagnosis not present

## 2024-04-03 DIAGNOSIS — E782 Mixed hyperlipidemia: Secondary | ICD-10-CM | POA: Diagnosis not present

## 2024-04-03 DIAGNOSIS — E8881 Metabolic syndrome: Secondary | ICD-10-CM

## 2024-04-03 DIAGNOSIS — R7303 Prediabetes: Secondary | ICD-10-CM

## 2024-04-03 DIAGNOSIS — Z7689 Persons encountering health services in other specified circumstances: Secondary | ICD-10-CM

## 2024-04-03 MED ORDER — ZEPBOUND 7.5 MG/0.5ML ~~LOC~~ SOAJ: 7.5000 mg | SUBCUTANEOUS | 0 refills | Status: AC

## 2024-04-03 NOTE — Progress Notes (Signed)
 Name: Sierra Buck   MRN: [id number]    DOB: 11-14-1960   Date:04/03/2024       Progress Note  Subjective:    Chief Complaint  Chief Complaint  Patient presents with   Medical Management of Chronic Issues    2 month follow-up   Obesity    I connected with  Dedra LELON Claude  on 04/03/24 at  1:00 PM EDT by a video enabled telemedicine application and verified that I am speaking with the correct person using two identifiers.  I discussed the limitations of evaluation and management by telemedicine and the availability of in person appointments. The patient expressed understanding and agreed to proceed. Staff also discussed with the patient that there may be a patient responsible charge related to this service. Patient Location: home Provider Location: [facility] clinic Additional Individuals present: none  HPI She was able to get 7.5 mg zepbound  in April - started in May and then could not get a refill from pharmacy and has been out again for about 5-6 weeks She did tolerate restarting the med with only mild SE for a day or two, but none after that Diet efforts- sig continued efforts She is doing dietician/nutritionist counseling Limiting processed foods,  salt intake, carbs working whole grain, smaller portions for foods, protein goals  Walking 30 min Highest weight about a year ago before starting wegovy  then zepbound  248, currently about 225, down 23 lbs 9% body weight loss and then maintained even w/o getting meds regularly refilled (possibly a problem with our prescriptions and how the pharmacy dispenses - we will be calling pharmacy to f/up on this).  Hx of OSA, prediabetes, HLD metabolic syndrome Lipids and A1c labs have improved with these weight loss meds, diet/lifestyle changes and weight loss.    Patient Active Problem List   Diagnosis Date Noted   Gastroesophageal reflux disease 12/12/2023   History of colonic polyps 02/20/2023   Polyp of descending colon 02/20/2023    Class 2 obesity with body mass index (BMI) of 39.0 to 39.9 in adult 07/21/2022   OSA on CPAP 02/17/2022   Bilateral lower extremity edema 02/17/2022   Metabolic syndrome 02/17/2022   Hyperlipidemia 02/17/2022   Prediabetes 02/17/2022   Seborrheic keratoses 03/02/2017    Social History   Tobacco Use   Smoking status: Never   Smokeless tobacco: Never  Substance Use Topics   Alcohol use: Yes    Alcohol/week: 0.0 standard drinks of alcohol    Comment: occasional     Current Outpatient Medications:    Misc Natural Products (BEET ROOT PO), Take by mouth., Disp: , Rfl:    MULTIPLE VITAMINS PO, Take by mouth., Disp: , Rfl:    OVER THE COUNTER MEDICATION, Nutrafol, Disp: , Rfl:    tirzepatide  (ZEPBOUND ) 7.5 MG/0.5ML Pen, Inject 7.5 mg into the skin once a week., Disp: 6 mL, Rfl: 0   TURMERIC PO, Take by mouth daily., Disp: , Rfl:   No Known Allergies  I personally reviewed active problem list, medication list, allergies, family history, social history, health maintenance, notes from last encounter, lab results, imaging with the patient/caregiver today.   Review of Systems  All other systems reviewed and are negative.    Objective:   Virtual encounter, vitals limited, only able to obtain the following Today's Vitals   04/03/24 1128  Weight: 225 lb (102.1 kg)   Body mass index is 38.62 kg/m. Nursing Note and Vital Signs reviewed.  Physical Exam Vitals and nursing  note reviewed.  Constitutional:      General: She is not in acute distress.    Appearance: Normal appearance. She is obese. She is not ill-appearing, toxic-appearing or diaphoretic.  Pulmonary:     Effort: No respiratory distress.   Neurological:     Mental Status: She is alert.   Psychiatric:        Mood and Affect: Mood normal.        Behavior: Behavior normal.     PE limited by virtual encounter  No results found for this or any previous visit (from the past 72 hours).  Assessment and Plan:    Class 2 obesity with body mass index (BMI) of 39.0 to 39.9 in adult, unspecified obesity type, unspecified whether serious comorbidity present Assessment & Plan: See last A&P Unfortunately pt has only gotten zepbound  7.5 mg dose x 4 weeks and then did not get refills despite Rx being for 12 weeks Fortunately she has maintained her weight and is currently working with [facility] dietician for additional nutritional/diet efforts - see HPI for details on diet changes She is also increasing daily exercise to 30 min walking daily Plan to get her back on zepbound  7.5 mg x 4 weeks, then increase back to 10 mg and slowly titrate up dose to max. Once she is getting close to her goal weight we will plan to do maintenance dosing with continued die/lifestyle efforts  12/12/2023 A&P: Pt is still down about 19-20 lbs from high weight with help of wegovy  and then she switched to zepbound  She has been on 10 mg dose for about 8 months and mostly tolerating this with some nausea on the day or two around the injection No vomiting She has been out meds for 3 weeks Will restart her on 7.5 mg dose for a few weeks and then she can resume 10 mg dose She will check with her insurance for 1 vs 3 month coverage at the preferred pharmacy. We have been doing 12 week supply but it seems she is only getting 4 weeks Rxs sent to her  She would like to continue zepbound , encouraged her to continue healthy diet efforts and increase physical activity  Orders: -     Zepbound ; Inject 7.5 mg into the skin once a week.  Dispense: 2 mL; Refill: 0  Encounter for weight management -     Zepbound ; Inject 7.5 mg into the skin once a week.  Dispense: 2 mL; Refill: 0  Metabolic syndrome -     Zepbound ; Inject 7.5 mg into the skin once a week.  Dispense: 2 mL; Refill: 0  Prediabetes Assessment & Plan: Recheck A1c at next OV in about 12 weeks/3 months  Lab Results  Component Value Date   HGBA1C 5.8 (H) 01/31/2024   HGBA1C 6.0 (H)  02/16/2022      Mixed hyperlipidemia Assessment & Plan: Labs have improved with diet/lifestyle efforts, weight loss and wegovy /zepbound  Lab Results  Component Value Date   CHOL 221 (H) 01/31/2024   CHOL 261 (H) 02/16/2022   CHOL 238 (H) 08/06/2019   Lab Results  Component Value Date   HDL 73 01/31/2024   HDL 75 02/16/2022   HDL 71 08/06/2019   Lab Results  Component Value Date   LDLCALC 136 (H) 01/31/2024   LDLCALC 159 (H) 02/16/2022   LDLCALC 151 (H) 08/06/2019   Lab Results  Component Value Date   TRIG 66 01/31/2024   TRIG 153 (H) 02/16/2022   TRIG 95 08/06/2019  Weight max 248 lbs about  a year ago prior to doing the meds   Wt Readings from Last 10 Encounters:  04/03/24 225 lb (102.1 kg)  03/28/24 225 lb 14.4 oz (102.5 kg)  01/31/24 228 lb 10.1 oz (103.7 kg)  12/12/23 228 lb (103.4 kg)  09/11/23 226 lb (102.5 kg)  03/13/23 221 lb 4.8 oz (100.4 kg)  02/20/23 208 lb (94.3 kg)  01/26/23 213 lb 1.6 oz (96.7 kg)  12/12/22 211 lb 3.2 oz (95.8 kg)  08/22/22 221 lb 6.4 oz (100.4 kg)   BMI Readings from Last 5 Encounters:  04/03/24 38.62 kg/m  03/28/24 38.78 kg/m  01/31/24 39.24 kg/m  12/12/23 39.14 kg/m  09/11/23 38.79 kg/m   Will do in office f/up in about 12 weeks in person with f/up labs Pt asked to send mychart message when she is 3 weeks into next Rx and let us  know if she wants to increase to 10 mg dose so I can send in the refills to the pharmacy For now will only send in 4 week supplies w or w/o refills depending on pt tolerance   Return in about 12 weeks (around 06/26/2024) for in person visit for weight/meds .    - I discussed the assessment and treatment plan with the patient. The patient was provided an opportunity to ask questions and all were answered. The patient agreed with the plan and demonstrated an understanding of the instructions.  I provided 20+ minutes of non-face-to-face time during this encounter.  Michelene Cower,  PA-C 04/03/24 1:07 PM

## 2024-04-03 NOTE — Assessment & Plan Note (Signed)
 Recheck A1c at next OV in about 12 weeks/3 months  Lab Results  Component Value Date   HGBA1C 5.8 (H) 01/31/2024   HGBA1C 6.0 (H) 02/16/2022

## 2024-04-03 NOTE — Patient Instructions (Signed)
 Please let me know in about 3-4 weeks if you want the meds refilled at the 7.5 mg dose or increase back up to 10 mg dose and that way I can send the refill to your pharmacy at that time.

## 2024-04-03 NOTE — Assessment & Plan Note (Signed)
 See last A&P Unfortunately pt has only gotten zepbound  7.5 mg dose x 4 weeks and then did not get refills despite Rx being for 12 weeks Fortunately she has maintained her weight and is currently working with [facility] dietician for additional nutritional/diet efforts - see HPI for details on diet changes She is also increasing daily exercise to 30 min walking daily Plan to get her back on zepbound  7.5 mg x 4 weeks, then increase back to 10 mg and slowly titrate up dose to max. Once she is getting close to her goal weight we will plan to do maintenance dosing with continued die/lifestyle efforts  12/12/2023 A&P: Pt is still down about 19-20 lbs from high weight with help of wegovy  and then she switched to zepbound  She has been on 10 mg dose for about 8 months and mostly tolerating this with some nausea on the day or two around the injection No vomiting She has been out meds for 3 weeks Will restart her on 7.5 mg dose for a few weeks and then she can resume 10 mg dose She will check with her insurance for 1 vs 3 month coverage at the preferred pharmacy. We have been doing 12 week supply but it seems she is only getting 4 weeks Rxs sent to her  She would like to continue zepbound , encouraged her to continue healthy diet efforts and increase physical activity

## 2024-04-03 NOTE — Assessment & Plan Note (Signed)
 Labs have improved with diet/lifestyle efforts, weight loss and wegovy /zepbound  Lab Results  Component Value Date   CHOL 221 (H) 01/31/2024   CHOL 261 (H) 02/16/2022   CHOL 238 (H) 08/06/2019   Lab Results  Component Value Date   HDL 73 01/31/2024   HDL 75 02/16/2022   HDL 71 08/06/2019   Lab Results  Component Value Date   LDLCALC 136 (H) 01/31/2024   LDLCALC 159 (H) 02/16/2022   LDLCALC 151 (H) 08/06/2019   Lab Results  Component Value Date   TRIG 66 01/31/2024   TRIG 153 (H) 02/16/2022   TRIG 95 08/06/2019

## 2024-04-15 ENCOUNTER — Encounter: Admitting: Family Medicine

## 2024-04-15 DIAGNOSIS — Z0000 Encounter for general adult medical examination without abnormal findings: Secondary | ICD-10-CM

## 2024-04-15 DIAGNOSIS — E66812 Obesity, class 2: Secondary | ICD-10-CM

## 2024-04-15 DIAGNOSIS — E782 Mixed hyperlipidemia: Secondary | ICD-10-CM

## 2024-04-15 DIAGNOSIS — E8881 Metabolic syndrome: Secondary | ICD-10-CM

## 2024-04-15 DIAGNOSIS — R7303 Prediabetes: Secondary | ICD-10-CM

## 2024-04-15 DIAGNOSIS — G4733 Obstructive sleep apnea (adult) (pediatric): Secondary | ICD-10-CM

## 2024-06-03 ENCOUNTER — Ambulatory Visit: Admitting: Dietician

## 2024-06-21 ENCOUNTER — Other Ambulatory Visit (HOSPITAL_COMMUNITY): Payer: Self-pay

## 2024-06-21 ENCOUNTER — Telehealth: Payer: Self-pay | Admitting: Pharmacy Technician

## 2024-06-21 NOTE — Telephone Encounter (Signed)
 Pharmacy Patient Advocate Encounter   Received notification from CoverMyMeds that prior authorization for Zepbound  7.5MG /0.5ML pen-injectors is due for renewal.   Insurance verification completed.   The patient is insured through [facility].  PLAN REQUIRES UPDATED CHART NOTES TO SEND RENEWAL PA (WEIGHT & BMI WITHIN THE LAST 45 DAYS, LIFE STYLE MODIFICATIONS, EXERCISE, AND REDUCED CALORIES LINGO) HER CURRENT PA WILL EXPIRE 07/19/2024

## 2024-06-24 ENCOUNTER — Other Ambulatory Visit (HOSPITAL_COMMUNITY): Payer: Self-pay

## 2024-08-07 ENCOUNTER — Encounter: Attending: Family Medicine | Admitting: Dietician

## 2024-08-07 ENCOUNTER — Encounter: Payer: Self-pay | Admitting: Dietician

## 2024-08-07 VITALS — Ht 64.0 in | Wt 225.5 lb

## 2024-08-07 DIAGNOSIS — Z6838 Body mass index (BMI) 38.0-38.9, adult: Secondary | ICD-10-CM | POA: Insufficient documentation

## 2024-08-07 DIAGNOSIS — Z7689 Persons encountering health services in other specified circumstances: Secondary | ICD-10-CM | POA: Diagnosis not present

## 2024-08-07 DIAGNOSIS — E66812 Obesity, class 2: Secondary | ICD-10-CM | POA: Diagnosis not present

## 2024-08-07 DIAGNOSIS — Z6839 Body mass index (BMI) 39.0-39.9, adult: Secondary | ICD-10-CM | POA: Diagnosis present

## 2024-08-07 DIAGNOSIS — R7303 Prediabetes: Secondary | ICD-10-CM | POA: Diagnosis present

## 2024-08-07 DIAGNOSIS — Z713 Dietary counseling and surveillance: Secondary | ICD-10-CM | POA: Insufficient documentation

## 2024-08-07 DIAGNOSIS — G4733 Obstructive sleep apnea (adult) (pediatric): Secondary | ICD-10-CM | POA: Diagnosis not present

## 2024-08-07 DIAGNOSIS — E785 Hyperlipidemia, unspecified: Secondary | ICD-10-CM | POA: Insufficient documentation

## 2024-08-07 DIAGNOSIS — E782 Mixed hyperlipidemia: Secondary | ICD-10-CM

## 2024-08-07 DIAGNOSIS — E669 Obesity, unspecified: Secondary | ICD-10-CM

## 2024-08-07 DIAGNOSIS — E8881 Metabolic syndrome: Secondary | ICD-10-CM | POA: Diagnosis present

## 2024-08-07 NOTE — Progress Notes (Signed)
 Medical Nutrition Therapy: Visit start time: 1420  end time: 1455  Assessment:  Diagnosis: Metabolic syndrome: mixed HLD, prediabetes, OSA, obesity Medical history changes: no changes Psychosocial issues/ stress concerns: none  Medications, supplement changes: no changes per patient   Current weight: 225.5lbs Height: 5'4 BMI: 38.71  Progress and evaluation:  Weight within 1lb of previous visit on 03/28/24; likely heavier clothing today. Reports good control of portions of meats; continues to have some cravings for sweets, uses small portions of dark chocolate Feels she is moving more during the day as of 1 week ago, due to having to report to work in person 2-3 times a week, after 5 years of working from home. Snacks will be less accessible. Husband on short-term disability due to recent injury, and has been cooking breakfast meals most days so patient has increased breakfast intake.  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     Dietary Intake:  Usual eating pattern includes 2-3 meals and 1 snacks per day. Dining out frequency:   Breakfast: usu black coffee, often skips or eats fruit; more cooked breakfast in recent months due to husband home after injury Snack: none Lunch: leftovers; fish and veggies; sandwich; Factor 75 meal Snack: small portion dark chocolate Supper: 10/28 1 slice pizza; husband cooking recently salmon + veg; spaghetti; chili with beans Snack: no sweets recently; usually none Beverages: black coffee; water ; occ cranberry juice or [place] soda (made with cane sugar) or sweet tea  Physical activity: some walking on treadmill, no regular routine.   Intervention:   Nutrition Care Education:  Basic nutrition: reviewed appropriate nutrient balance; general nutrition guidelines    Weight control: reviewed progress since previous visit; portion control; keeping extra sugar and fat low in foods and beverages; increasing physical activity/  exercise Advanced nutrition:  dining out -- eating 1/2 of meal as a rule prediabetes:  reviewed healthy carb choices    Other Intervention Notes: Patient continues to work on positive lifestyle change; has met some of goals set at previous visit Updated goals with input from patient Patient declined scheduling another visit at this time; will do so later if needed.   Nutritional Diagnosis:  [place]-2.1 Inpaired nutrition utilization and [place]-2.2 Altered nutrition-related laboratory As related to metabolic syndrome.  As evidenced by elevated total cholesterol and LDL, elevated HbA1C, sleep apnea. [place]-3.3 Overweight/obesity As related to excess calories, inadequate physical activity.  As evidenced by patient with current BMI of 38.71.   Education Materials given:  Visit summary with goals/ instructions   Learner/ who was taught:  Patient   Level of understanding: Verbalizes/ demonstrates competency  Demonstrated degree of understanding via:   Teach back Learning barriers: None  Willingness to learn/ readiness for change: Eager, change in progress   Monitoring and Evaluation:  Dietary intake, exercise, and body weight      follow up: prn

## 2024-08-07 NOTE — Patient Instructions (Addendum)
 Try adding a veg to smoothies, such as cucumber or carrots, along with protein and minimal sugar. Make restaurant meals into 2 meals by packing up 1/2 when the meal arrives. Look for any ingredients (toppings, sauces, mayo, butter, etc then can be left off to save some calories.  Continue to make mostly healthy food choices, limiting extra fat and sugar as able.  Keep working to gradually increase physical activity
